# Patient Record
Sex: Male | Born: 1965 | Race: Black or African American | Hispanic: No | Marital: Single | Smoking: Former smoker
Health system: Southern US, Community
[De-identification: ages and names within clinical notes are randomized; demographics above are authoritative.]

## PROBLEM LIST (undated history)

## (undated) DIAGNOSIS — M48061 Spinal stenosis, lumbar region without neurogenic claudication: Secondary | ICD-10-CM

## (undated) DIAGNOSIS — Z8709 Personal history of other diseases of the respiratory system: Secondary | ICD-10-CM

## (undated) DIAGNOSIS — R600 Localized edema: Secondary | ICD-10-CM

## (undated) DIAGNOSIS — G4730 Sleep apnea, unspecified: Secondary | ICD-10-CM

## (undated) DIAGNOSIS — I10 Essential (primary) hypertension: Secondary | ICD-10-CM

## (undated) DIAGNOSIS — E785 Hyperlipidemia, unspecified: Secondary | ICD-10-CM

## (undated) DIAGNOSIS — H902 Conductive hearing loss, unspecified: Secondary | ICD-10-CM

## (undated) DIAGNOSIS — G629 Polyneuropathy, unspecified: Secondary | ICD-10-CM

## (undated) DIAGNOSIS — F419 Anxiety disorder, unspecified: Secondary | ICD-10-CM

## (undated) DIAGNOSIS — K30 Functional dyspepsia: Secondary | ICD-10-CM

## (undated) DIAGNOSIS — F329 Major depressive disorder, single episode, unspecified: Secondary | ICD-10-CM

## (undated) DIAGNOSIS — R351 Nocturia: Secondary | ICD-10-CM

## (undated) DIAGNOSIS — I2510 Atherosclerotic heart disease of native coronary artery without angina pectoris: Secondary | ICD-10-CM

## (undated) DIAGNOSIS — M7540 Impingement syndrome of unspecified shoulder: Secondary | ICD-10-CM

## (undated) DIAGNOSIS — I219 Acute myocardial infarction, unspecified: Secondary | ICD-10-CM

## (undated) DIAGNOSIS — F32A Depression, unspecified: Secondary | ICD-10-CM

## (undated) HISTORY — PX: CORONARY ANGIOPLASTY: SHX604

## (undated) HISTORY — PX: HERNIA REPAIR: SHX51

## (undated) HISTORY — PX: CARDIAC CATHETERIZATION: SHX172

## (undated) HISTORY — PX: COLONOSCOPY: SHX174

---

## 2002-10-30 DIAGNOSIS — I219 Acute myocardial infarction, unspecified: Secondary | ICD-10-CM

## 2002-10-30 HISTORY — DX: Acute myocardial infarction, unspecified: I21.9

## 2016-01-31 ENCOUNTER — Other Ambulatory Visit: Payer: Self-pay | Admitting: Neurological Surgery

## 2016-02-14 ENCOUNTER — Encounter (HOSPITAL_COMMUNITY): Payer: Self-pay

## 2016-02-14 ENCOUNTER — Encounter (HOSPITAL_COMMUNITY)
Admission: RE | Admit: 2016-02-14 | Discharge: 2016-02-14 | Disposition: A | Discharge status: Home / Self Care | Payer: Medicaid Other | Source: Ambulatory Visit | Attending: Neurological Surgery | Admitting: Neurological Surgery

## 2016-02-14 DIAGNOSIS — I252 Old myocardial infarction: Secondary | ICD-10-CM | POA: Insufficient documentation

## 2016-02-14 DIAGNOSIS — F419 Anxiety disorder, unspecified: Secondary | ICD-10-CM | POA: Insufficient documentation

## 2016-02-14 DIAGNOSIS — Z87891 Personal history of nicotine dependence: Secondary | ICD-10-CM | POA: Diagnosis not present

## 2016-02-14 DIAGNOSIS — Z7902 Long term (current) use of antithrombotics/antiplatelets: Secondary | ICD-10-CM | POA: Diagnosis not present

## 2016-02-14 DIAGNOSIS — F329 Major depressive disorder, single episode, unspecified: Secondary | ICD-10-CM | POA: Diagnosis not present

## 2016-02-14 DIAGNOSIS — I2510 Atherosclerotic heart disease of native coronary artery without angina pectoris: Secondary | ICD-10-CM | POA: Insufficient documentation

## 2016-02-14 DIAGNOSIS — Z01818 Encounter for other preprocedural examination: Secondary | ICD-10-CM | POA: Insufficient documentation

## 2016-02-14 DIAGNOSIS — Z955 Presence of coronary angioplasty implant and graft: Secondary | ICD-10-CM | POA: Diagnosis not present

## 2016-02-14 DIAGNOSIS — Z01812 Encounter for preprocedural laboratory examination: Secondary | ICD-10-CM | POA: Insufficient documentation

## 2016-02-14 DIAGNOSIS — G629 Polyneuropathy, unspecified: Secondary | ICD-10-CM | POA: Insufficient documentation

## 2016-02-14 DIAGNOSIS — M4806 Spinal stenosis, lumbar region: Secondary | ICD-10-CM | POA: Diagnosis not present

## 2016-02-14 DIAGNOSIS — E785 Hyperlipidemia, unspecified: Secondary | ICD-10-CM | POA: Insufficient documentation

## 2016-02-14 DIAGNOSIS — G4733 Obstructive sleep apnea (adult) (pediatric): Secondary | ICD-10-CM | POA: Insufficient documentation

## 2016-02-14 DIAGNOSIS — I10 Essential (primary) hypertension: Secondary | ICD-10-CM | POA: Diagnosis not present

## 2016-02-14 DIAGNOSIS — Z79899 Other long term (current) drug therapy: Secondary | ICD-10-CM | POA: Diagnosis not present

## 2016-02-14 DIAGNOSIS — M48061 Spinal stenosis, lumbar region without neurogenic claudication: Secondary | ICD-10-CM

## 2016-02-14 HISTORY — DX: Atherosclerotic heart disease of native coronary artery without angina pectoris: I25.10

## 2016-02-14 HISTORY — DX: Functional dyspepsia: K30

## 2016-02-14 HISTORY — DX: Hyperlipidemia, unspecified: E78.5

## 2016-02-14 HISTORY — DX: Conductive hearing loss, unspecified: H90.2

## 2016-02-14 HISTORY — DX: Impingement syndrome of unspecified shoulder: M75.40

## 2016-02-14 HISTORY — DX: Polyneuropathy, unspecified: G62.9

## 2016-02-14 HISTORY — DX: Major depressive disorder, single episode, unspecified: F32.9

## 2016-02-14 HISTORY — DX: Nocturia: R35.1

## 2016-02-14 HISTORY — DX: Acute myocardial infarction, unspecified: I21.9

## 2016-02-14 HISTORY — DX: Essential (primary) hypertension: I10

## 2016-02-14 HISTORY — DX: Anxiety disorder, unspecified: F41.9

## 2016-02-14 HISTORY — DX: Spinal stenosis, lumbar region without neurogenic claudication: M48.061

## 2016-02-14 HISTORY — DX: Personal history of other diseases of the respiratory system: Z87.09

## 2016-02-14 HISTORY — DX: Sleep apnea, unspecified: G47.30

## 2016-02-14 HISTORY — DX: Depression, unspecified: F32.A

## 2016-02-14 LAB — CBC WITH DIFFERENTIAL/PLATELET
Basophils Absolute: 0 K/uL (ref 0.0–0.1)
Basophils Relative: 1 %
Eosinophils Absolute: 0.2 K/uL (ref 0.0–0.7)
Eosinophils Relative: 4 %
HCT: 45.7 % (ref 39.0–52.0)
Hemoglobin: 14.5 g/dL (ref 13.0–17.0)
Lymphocytes Relative: 38 %
Lymphs Abs: 2 K/uL (ref 0.7–4.0)
MCH: 26.7 pg (ref 26.0–34.0)
MCHC: 31.7 g/dL (ref 30.0–36.0)
MCV: 84 fL (ref 78.0–100.0)
Monocytes Absolute: 0.5 K/uL (ref 0.1–1.0)
Monocytes Relative: 10 %
Neutro Abs: 2.6 K/uL (ref 1.7–7.7)
Neutrophils Relative %: 47 %
Platelets: 212 K/uL (ref 150–400)
RBC: 5.44 MIL/uL (ref 4.22–5.81)
RDW: 14.5 % (ref 11.5–15.5)
WBC: 5.3 K/uL (ref 4.0–10.5)

## 2016-02-14 LAB — BASIC METABOLIC PANEL
ANION GAP: 8 (ref 5–15)
BUN: 11 mg/dL (ref 6–20)
CALCIUM: 9.4 mg/dL (ref 8.9–10.3)
CO2: 24 mmol/L (ref 22–32)
CREATININE: 1.06 mg/dL (ref 0.61–1.24)
Chloride: 110 mmol/L (ref 101–111)
GFR calc Af Amer: >60 mL/min (ref >60)
GLUCOSE: 90 mg/dL (ref 65–99)
Potassium: 4.8 mmol/L (ref 3.5–5.1)
Sodium: 142 mmol/L (ref 135–145)

## 2016-02-14 LAB — PROTIME-INR
INR: 1 (ref 0.00–1.49)
PROTHROMBIN TIME: 13.4 s (ref 11.6–15.2)

## 2016-02-14 LAB — SURGICAL PCR SCREEN
MRSA, PCR: NEGATIVE
Staphylococcus aureus: NEGATIVE

## 2016-02-14 NOTE — Pre-Procedure Instructions (Signed)
    Lytle ButteGregory D Choung  02/14/2016      [facility] [place], [place] - [address] [name] [id number] Phone: [phone] Fax: [phone]    Your procedure is scheduled on Thursday, April 20th, 2017.  [facility] Ad[facility] at 10:15 A.M.   Call this number if you have problems the morning of surgery:  [phone]   Remember:  Do not eat food or drink liquids after midnight.   Take these medicines the morning of surgery with A SIP OF WATER: Baclofen (Lioresal), Fluticasone (Flonase), Gabapentin (Neurontin), Ipratropium (Atrovent), Labetalol (Normodyne), Oxycodone-acetaminophen (Percocet) if needed.  Stop taking: Clopidogrel (Plavix), Loratadine-pseudoephedrine (Allery Relief/Nasal Decongest), Aspirin, NSAIDS, Aleve, Naproxen, Ibuprofen, Advil, Motrin, BC's, Goody's, Fish oil, all herbal medications, and all vitamins.    Do not wear jewelry.  Do not wear lotions, powders, or colognes.  You may NOT wear deodorant.  Men may shave face and neck.  Do not bring valuables to the hospital.   [facility] is not responsible for any belongings or valuables.  Contacts, dentures or bridgework may not be worn into surgery.  Leave your suitcase in the car.  After surgery it may be brought to your room.  For patients admitted to the hospital, discharge time will be determined by your treatment team.  Patients discharged the day of surgery will not be allowed to drive home.   Special instructions:  See attached.   Please read over the following fact sheets that you were given. Pain Booklet, Coughing and Deep Breathing, MRSA Information and Surgical Site Infection Prevention
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
## 2016-02-15 NOTE — Progress Notes (Signed)
Anesthesia Chart Review: Patient is a 50 year old male scheduled for L3-4, L4-5, L5-S1 laminectomies and foraminotomies on 02/17/16 by Dr. Yetta BarreJones.  History includes former smoker, HTN, spinal stenosis, neuropathy, CAD/STEMI X 2 s/p DES to pLAD '04 with subsequent in-stent thrombosis '10 s/p thrombectomy and DES to mLAD, dyslipidemia, OSA (no CPAP currently), conductive hearing loss, depression, anxiety. BMI is consistent with obesity. PCP is Dr. Rae MarBrent Frederick.   Cardiologist is Dr. Tawana ScaleMaria Rangel (resident)/Dr. Therisa DoyneFrederic Kahl (attending) [facility]. Following his recent stress test (see below) she wrote,  "I had an extensive discussion with patient. His stress test shows an anterior infarct with mild peri-infarct ischemia and decreased LV function. He has no symptoms of angina or heart failure at present. He is at higher risk for peri-operative adverse cardiovascular events for his lumbar laminectomy, which is an intermediate risk surgery. At this point do not recommend invasive cardiovascular evaluation and favor medical treatment/optimization. He is already on optimal medical therapy with betablocker, ACEi, ASA, plavix and statin. The patient is very eager to have his laminectomy done and has been told he has a good chance for improvement, he has had worsening of his back pain on the past few months that has limited his functional capacity significantly.   I have instructed patient to complete an echocardiogram prior to his surgery for better structural/functional evaluation of the heart. He should also have the surgery performed at a facility where he can have cardiology consultation perioperatively. Plavix can be discontinued one week prior to surgery and restarted as soon as safe from a surgical standpoint in the post-op period. Would recommend continuing ASA perioperatively unless the risk of bleeding is prohibitively, in which case it can be stopped 7 days prior to surgery and resumed once safe in the  post-op period as well. The patient should return for outpatient follow up ~681moth after his laminectomy. These are the recommendations that will be given to his surgeon. Will send results from stress test via letter to patient.  Darrick GrinderMaria Octavia Rangel, MD Cardiology Fellow PGY-5 Pager [phone]  Meds include Lipitor, baclofen, Plavix (on hold starting 02/10/16), Flonase, Neurontin, Atrovent, lisinopril, labetalol, loratadine/pseudoephedrine, Percocet, Vitamin C.  12/14/15 EKG [facility]): SR, moderate voltage criteria for LVH, cannot rule out septal infarct (cited on on before 10/13/14). No significant change when compared to 10/13/14 tracing.  01/14/16 Echo [facility]; Care Everywhere): SUMMARY There is moderate concentric left ventricular hypertrophy. The left ventricular size is normal. Left ventricular systolic function is normal. LV ejection fraction = 55-60%. There are regional wall motion abnormalities as specified below. Mild mid to distal anterior and anteroseptal hypokinesis IVC size was normal. The right ventricle is normal in size and function. The left atrium is mildly dilated. No significant stenosis or regurgitation seen (trace MR/TR/PR) There is no pericardial effusion.  01/05/16 Nuclear stress test [facility]; Care Everywhere): 1. Myocardial infarction with peri-infarct ischemia in the apical anteroseptal wall. Myocardial infarction in the inferior apical segment. 2. Normal left ventricular function.  11/12/09 24 hr Holter monitor [facility]; Care Everywhere): SR with rare PACS and PVCs. No diary.  09/07/09 Cardiac Cath [facility]; Care Everywhere): Right dominance. 100% proximal LAD, in stent, 40% ostial. 90% mid LAD. 60% ostial CX. LM and RCA 0%. Conclusion: Obstructive 1V CAD. LVF 45% with abnormal wall motion (anterolateral and apical akinesis). S/P balloon/thrombectomy/ballon to proximal LAD and thrombectomy/balloon/Everolimus DES to mid LAD.  02/14/16 CXR: IMPRESSION: No active cardiopulmonary  disease.  Preoperative labs noted.   Patient has cardiac clearance with  no additional testing recommended prior to planned procedure. If no acute changes then I would anticipate that he could proceed as planned.  Velna Ochs [facility] Short Stay Center/Anesthesiology Phone [phone] 02/15/2016 10:28 AM

## 2016-02-16 NOTE — Anesthesia Preprocedure Evaluation (Addendum)
Anesthesia Evaluation  Patient identified by MRN, date of birth, ID band Patient awake    Reviewed: Allergy & Precautions, NPO status , Patient's Chart, lab work & pertinent test results, reviewed documented beta blocker date and time   Airway Mallampati: II   Neck ROM: Full    Dental  (+) Teeth Intact   Pulmonary sleep apnea (no CPAP) , former smoker,    breath sounds clear to auscultation       Cardiovascular hypertension, Pt. on medications + CAD, + Past MI (STEMI x 2) and + Cardiac Stents (mid LAD 2010)   Rhythm:Regular  Cleared by cardiology with no additional testing.  At increased risk, to stop plavix for 7 days prior, ECHO 12/2015 EF 60%, hypokinesis ant.septal area, Stress also with Ant. Septal ischemic area.     Neuro/Psych Anxiety Depression    GI/Hepatic negative GI ROS, Neg liver ROS,   Endo/Other  negative endocrine ROSOBESITY BMI 35  Renal/GU negative Renal ROS  negative genitourinary   Musculoskeletal Back pain   Abdominal   Peds  Hematology 14/46   Anesthesia Other Findings   Reproductive/Obstetrics                          Anesthesia Physical Anesthesia Plan  ASA: III  Anesthesia Plan: General   Post-op Pain Management:    Induction: Intravenous  Airway Management Planned: Oral ETT  Additional Equipment:   Intra-op Plan:   Post-operative Plan:   Informed Consent: I have reviewed the patients History and Physical, chart, labs and discussed the procedure including the risks, benefits and alternatives for the proposed anesthesia with the patient or authorized representative who has indicated his/her understanding and acceptance.     Plan Discussed with:   Anesthesia Plan Comments: ( will need to maintain perfusion pressure, will be off Plavix, at risk for Acute coronary syndrome and STENT thrombosis, Be nice if ASA could be continued, his last ASA was yesterday)        Anesthesia Quick Evaluation

## 2016-02-17 ENCOUNTER — Ambulatory Visit (HOSPITAL_COMMUNITY): Payer: Worker's Compensation | Admitting: Vascular Surgery

## 2016-02-17 ENCOUNTER — Ambulatory Visit (HOSPITAL_COMMUNITY)
Admission: RE | Admit: 2016-02-17 | Discharge: 2016-02-18 | Disposition: A | Discharge status: Home / Self Care | Payer: Worker's Compensation | Source: Ambulatory Visit | Attending: Neurological Surgery | Admitting: Neurological Surgery

## 2016-02-17 ENCOUNTER — Ambulatory Visit (HOSPITAL_COMMUNITY): Payer: Worker's Compensation | Admitting: Anesthesiology

## 2016-02-17 ENCOUNTER — Encounter (HOSPITAL_COMMUNITY)
Admission: RE | Disposition: A | Discharge status: Home / Self Care | Payer: Self-pay | Source: Ambulatory Visit | Attending: Neurological Surgery

## 2016-02-17 ENCOUNTER — Ambulatory Visit (HOSPITAL_COMMUNITY): Payer: Worker's Compensation

## 2016-02-17 ENCOUNTER — Encounter (HOSPITAL_COMMUNITY): Payer: Self-pay | Admitting: *Deleted

## 2016-02-17 DIAGNOSIS — E785 Hyperlipidemia, unspecified: Secondary | ICD-10-CM | POA: Diagnosis not present

## 2016-02-17 DIAGNOSIS — G629 Polyneuropathy, unspecified: Secondary | ICD-10-CM | POA: Diagnosis not present

## 2016-02-17 DIAGNOSIS — Z9889 Other specified postprocedural states: Secondary | ICD-10-CM

## 2016-02-17 DIAGNOSIS — Z955 Presence of coronary angioplasty implant and graft: Secondary | ICD-10-CM | POA: Insufficient documentation

## 2016-02-17 DIAGNOSIS — I10 Essential (primary) hypertension: Secondary | ICD-10-CM | POA: Insufficient documentation

## 2016-02-17 DIAGNOSIS — Z6835 Body mass index (BMI) 35.0-35.9, adult: Secondary | ICD-10-CM | POA: Diagnosis not present

## 2016-02-17 DIAGNOSIS — M4806 Spinal stenosis, lumbar region: Secondary | ICD-10-CM | POA: Diagnosis not present

## 2016-02-17 DIAGNOSIS — I252 Old myocardial infarction: Secondary | ICD-10-CM | POA: Diagnosis not present

## 2016-02-17 DIAGNOSIS — I2510 Atherosclerotic heart disease of native coronary artery without angina pectoris: Secondary | ICD-10-CM | POA: Diagnosis not present

## 2016-02-17 DIAGNOSIS — Z87891 Personal history of nicotine dependence: Secondary | ICD-10-CM | POA: Insufficient documentation

## 2016-02-17 DIAGNOSIS — Z7902 Long term (current) use of antithrombotics/antiplatelets: Secondary | ICD-10-CM | POA: Insufficient documentation

## 2016-02-17 DIAGNOSIS — M549 Dorsalgia, unspecified: Secondary | ICD-10-CM

## 2016-02-17 DIAGNOSIS — G4730 Sleep apnea, unspecified: Secondary | ICD-10-CM | POA: Diagnosis not present

## 2016-02-17 DIAGNOSIS — Z79899 Other long term (current) drug therapy: Secondary | ICD-10-CM | POA: Insufficient documentation

## 2016-02-17 DIAGNOSIS — E669 Obesity, unspecified: Secondary | ICD-10-CM | POA: Diagnosis not present

## 2016-02-17 DIAGNOSIS — Z7951 Long term (current) use of inhaled steroids: Secondary | ICD-10-CM | POA: Insufficient documentation

## 2016-02-17 HISTORY — PX: LUMBAR LAMINECTOMY/DECOMPRESSION MICRODISCECTOMY: SHX5026

## 2016-02-17 SURGERY — LUMBAR LAMINECTOMY/DECOMPRESSION MICRODISCECTOMY 3 LEVELS
Anesthesia: General | Site: Back | Laterality: Bilateral

## 2016-02-17 MED ORDER — ACETAMINOPHEN 650 MG RE SUPP: 650.0000 mg | RECTAL | Status: DC | PRN

## 2016-02-17 MED ORDER — PROPOFOL 10 MG/ML IV BOLUS
INTRAVENOUS | Status: AC
  Filled 2016-02-17: qty 20

## 2016-02-17 MED ORDER — FENTANYL CITRATE (PF) 250 MCG/5ML IJ SOLN
INTRAMUSCULAR | Status: AC
  Filled 2016-02-17: qty 5

## 2016-02-17 MED ORDER — POTASSIUM CHLORIDE IN NACL 20-0.9 MEQ/L-% IV SOLN
INTRAVENOUS | Status: DC
  Filled 2016-02-17 (×3): qty 1000

## 2016-02-17 MED ORDER — FLUTICASONE PROPIONATE 50 MCG/ACT NA SUSP
1.0000 | Freq: Two times a day (BID) | NASAL | Status: DC
Start: 2016-02-17 — End: 2016-02-18
  Administered 2016-02-17 – 2016-02-18 (×2): 1 via NASAL
  Filled 2016-02-17: qty 16

## 2016-02-17 MED ORDER — BACLOFEN 10 MG PO TABS
10.0000 mg | ORAL_TABLET | Freq: Three times a day (TID) | ORAL | Status: DC
  Administered 2016-02-17 – 2016-02-18 (×3): 10 mg via ORAL
  Filled 2016-02-17 (×4): qty 1

## 2016-02-17 MED ORDER — ONDANSETRON HCL 4 MG/2ML IJ SOLN
INTRAMUSCULAR | Status: AC
  Filled 2016-02-17: qty 2

## 2016-02-17 MED ORDER — GABAPENTIN 300 MG PO CAPS
300.0000 mg | ORAL_CAPSULE | Freq: Three times a day (TID) | ORAL | Status: DC
  Administered 2016-02-17 – 2016-02-18 (×3): 300 mg via ORAL
  Filled 2016-02-17 (×3): qty 1

## 2016-02-17 MED ORDER — THROMBIN 5000 UNITS EX SOLR
OROMUCOSAL | Status: DC | PRN
  Administered 2016-02-17: 10 mL via TOPICAL

## 2016-02-17 MED ORDER — GLYCOPYRROLATE 0.2 MG/ML IJ SOLN
INTRAMUSCULAR | Status: DC | PRN
  Administered 2016-02-17: 0.6 mg via INTRAVENOUS

## 2016-02-17 MED ORDER — CEFAZOLIN SODIUM-DEXTROSE 2-4 GM/100ML-% IV SOLN
2.0000 g | INTRAVENOUS | Status: AC
  Administered 2016-02-17: 2 g via INTRAVENOUS
  Filled 2016-02-17: qty 100

## 2016-02-17 MED ORDER — ACETAMINOPHEN 325 MG PO TABS: 650.0000 mg | ORAL_TABLET | ORAL | Status: DC | PRN

## 2016-02-17 MED ORDER — FENTANYL CITRATE (PF) 100 MCG/2ML IJ SOLN
INTRAMUSCULAR | Status: DC | PRN
  Administered 2016-02-17 (×5): 50 ug via INTRAVENOUS

## 2016-02-17 MED ORDER — PROPOFOL 10 MG/ML IV BOLUS
INTRAVENOUS | Status: DC | PRN
  Administered 2016-02-17: 200 mg via INTRAVENOUS

## 2016-02-17 MED ORDER — OXYCODONE-ACETAMINOPHEN 10-325 MG PO TABS: 1.0000 | ORAL_TABLET | ORAL | Status: DC | PRN

## 2016-02-17 MED ORDER — SODIUM CHLORIDE 0.9 % IV SOLN: 250.0000 mL | INTRAVENOUS | Status: DC

## 2016-02-17 MED ORDER — MIDAZOLAM HCL 2 MG/2ML IJ SOLN
INTRAMUSCULAR | Status: AC
  Filled 2016-02-17: qty 2

## 2016-02-17 MED ORDER — LIDOCAINE HCL (CARDIAC) 20 MG/ML IV SOLN
INTRAVENOUS | Status: DC | PRN
  Administered 2016-02-17: 100 mg via INTRAVENOUS

## 2016-02-17 MED ORDER — DEXAMETHASONE SODIUM PHOSPHATE 10 MG/ML IJ SOLN
10.0000 mg | INTRAMUSCULAR | Status: AC
  Administered 2016-02-17: 10 mg via INTRAVENOUS

## 2016-02-17 MED ORDER — ONDANSETRON HCL 4 MG/2ML IJ SOLN
INTRAMUSCULAR | Status: DC | PRN
  Administered 2016-02-17: 4 mg via INTRAVENOUS

## 2016-02-17 MED ORDER — MENTHOL 3 MG MT LOZG
1.0000 | LOZENGE | OROMUCOSAL | Status: DC | PRN
  Filled 2016-02-17: qty 9

## 2016-02-17 MED ORDER — VANCOMYCIN HCL 1000 MG IV SOLR
INTRAVENOUS | Status: DC | PRN
  Administered 2016-02-17: 1000 mg

## 2016-02-17 MED ORDER — LISINOPRIL 20 MG PO TABS
20.0000 mg | ORAL_TABLET | Freq: Every day | ORAL | Status: DC
  Administered 2016-02-17 – 2016-02-18 (×2): 20 mg via ORAL
  Filled 2016-02-17 (×2): qty 1

## 2016-02-17 MED ORDER — NEOSTIGMINE METHYLSULFATE 10 MG/10ML IV SOLN
INTRAVENOUS | Status: DC | PRN
  Administered 2016-02-17: 4 mg via INTRAVENOUS

## 2016-02-17 MED ORDER — MEPERIDINE HCL 25 MG/ML IJ SOLN: 6.2500 mg | INTRAMUSCULAR | Status: DC | PRN

## 2016-02-17 MED ORDER — GLYCOPYRROLATE 0.2 MG/ML IJ SOLN
INTRAMUSCULAR | Status: AC
  Filled 2016-02-17: qty 3

## 2016-02-17 MED ORDER — ARTIFICIAL TEARS OP OINT
TOPICAL_OINTMENT | OPHTHALMIC | Status: AC
  Filled 2016-02-17: qty 3.5

## 2016-02-17 MED ORDER — MORPHINE SULFATE (PF) 2 MG/ML IV SOLN: 1.0000 mg | INTRAVENOUS | Status: DC | PRN

## 2016-02-17 MED ORDER — FENTANYL CITRATE (PF) 100 MCG/2ML IJ SOLN: 25.0000 ug | INTRAMUSCULAR | Status: DC | PRN

## 2016-02-17 MED ORDER — SODIUM CHLORIDE 0.9 % IR SOLN
Status: DC | PRN
  Administered 2016-02-17: 500 mL

## 2016-02-17 MED ORDER — PHENOL 1.4 % MT LIQD
1.0000 | OROMUCOSAL | Status: DC | PRN
  Administered 2016-02-17: 1 via OROMUCOSAL
  Filled 2016-02-17: qty 177

## 2016-02-17 MED ORDER — IPRATROPIUM BROMIDE 0.06 % NA SOLN
1.0000 | Freq: Two times a day (BID) | NASAL | Status: DC
  Administered 2016-02-17 – 2016-02-18 (×2): 1 via NASAL
  Filled 2016-02-17: qty 15

## 2016-02-17 MED ORDER — CEFAZOLIN SODIUM 1-5 GM-% IV SOLN
1.0000 g | Freq: Three times a day (TID) | INTRAVENOUS | Status: AC
  Administered 2016-02-17 – 2016-02-18 (×2): 1 g via INTRAVENOUS
  Filled 2016-02-17 (×2): qty 50

## 2016-02-17 MED ORDER — THROMBIN 5000 UNITS EX SOLR
CUTANEOUS | Status: DC | PRN
  Administered 2016-02-17 (×2): 5000 [IU] via TOPICAL

## 2016-02-17 MED ORDER — HEMOSTATIC AGENTS (NO CHARGE) OPTIME
TOPICAL | Status: DC | PRN
  Administered 2016-02-17: 1 via TOPICAL

## 2016-02-17 MED ORDER — FENTANYL CITRATE (PF) 100 MCG/2ML IJ SOLN
INTRAMUSCULAR | Status: AC
  Filled 2016-02-17: qty 4

## 2016-02-17 MED ORDER — OXYCODONE HCL 5 MG PO TABS
5.0000 mg | ORAL_TABLET | ORAL | Status: DC | PRN
  Administered 2016-02-17 – 2016-02-18 (×4): 5 mg via ORAL
  Filled 2016-02-17 (×4): qty 1

## 2016-02-17 MED ORDER — 0.9 % SODIUM CHLORIDE (POUR BTL) OPTIME
TOPICAL | Status: DC | PRN
  Administered 2016-02-17: 1000 mL

## 2016-02-17 MED ORDER — ONDANSETRON HCL 4 MG/2ML IJ SOLN: 4.0000 mg | INTRAMUSCULAR | Status: DC | PRN

## 2016-02-17 MED ORDER — DEXAMETHASONE SODIUM PHOSPHATE 10 MG/ML IJ SOLN
INTRAMUSCULAR | Status: AC
  Filled 2016-02-17: qty 1

## 2016-02-17 MED ORDER — OXYCODONE-ACETAMINOPHEN 5-325 MG PO TABS
1.0000 | ORAL_TABLET | ORAL | Status: DC | PRN
Start: 2016-02-17 — End: 2016-02-18
  Administered 2016-02-17 – 2016-02-18 (×4): 1 via ORAL
  Filled 2016-02-17 (×4): qty 1

## 2016-02-17 MED ORDER — LABETALOL HCL 200 MG PO TABS
200.0000 mg | ORAL_TABLET | Freq: Two times a day (BID) | ORAL | Status: DC
  Administered 2016-02-17 – 2016-02-18 (×2): 200 mg via ORAL
  Filled 2016-02-17 (×2): qty 1

## 2016-02-17 MED ORDER — MIDAZOLAM HCL 5 MG/5ML IJ SOLN
INTRAMUSCULAR | Status: DC | PRN
  Administered 2016-02-17: 2 mg via INTRAVENOUS

## 2016-02-17 MED ORDER — VITAMIN C 500 MG PO TABS
500.0000 mg | ORAL_TABLET | Freq: Every day | ORAL | Status: DC
  Administered 2016-02-17 – 2016-02-18 (×2): 500 mg via ORAL
  Filled 2016-02-17 (×2): qty 1

## 2016-02-17 MED ORDER — LACTATED RINGERS IV SOLN
INTRAVENOUS | Status: DC
  Administered 2016-02-17 (×2): via INTRAVENOUS

## 2016-02-17 MED ORDER — ACETAMINOPHEN 10 MG/ML IV SOLN
INTRAVENOUS | Status: DC | PRN
  Administered 2016-02-17: 1000 mg via INTRAVENOUS

## 2016-02-17 MED ORDER — SODIUM CHLORIDE 0.9% FLUSH: 3.0000 mL | INTRAVENOUS | Status: DC | PRN

## 2016-02-17 MED ORDER — VANCOMYCIN HCL 1000 MG IV SOLR
INTRAVENOUS | Status: AC
  Filled 2016-02-17: qty 1000

## 2016-02-17 MED ORDER — ONDANSETRON HCL 4 MG/2ML IJ SOLN: 4.0000 mg | Freq: Once | INTRAMUSCULAR | Status: DC

## 2016-02-17 MED ORDER — LIDOCAINE HCL (CARDIAC) 20 MG/ML IV SOLN
INTRAVENOUS | Status: AC
  Filled 2016-02-17: qty 5

## 2016-02-17 MED ORDER — SODIUM CHLORIDE 0.9% FLUSH
3.0000 mL | Freq: Two times a day (BID) | INTRAVENOUS | Status: DC
  Administered 2016-02-17: 3 mL via INTRAVENOUS

## 2016-02-17 MED ORDER — ROCURONIUM BROMIDE 100 MG/10ML IV SOLN
INTRAVENOUS | Status: DC | PRN
  Administered 2016-02-17: 10 mg via INTRAVENOUS
  Administered 2016-02-17 (×2): 20 mg via INTRAVENOUS
  Administered 2016-02-17: 10 mg via INTRAVENOUS
  Administered 2016-02-17: 40 mg via INTRAVENOUS

## 2016-02-17 SURGICAL SUPPLY — 45 items
ADH SKN CLS APL DERMABOND .7 (GAUZE/BANDAGES/DRESSINGS) ×1
APL SKNCLS STERI-STRIP NONHPOA (GAUZE/BANDAGES/DRESSINGS) ×1
BAG DECANTER FOR FLEXI CONT (MISCELLANEOUS) ×3 IMPLANT
BENZOIN TINCTURE PRP APPL 2/3 (GAUZE/BANDAGES/DRESSINGS) ×3 IMPLANT
BUR MATCHSTICK NEURO 3.0 LAGG (BURR) ×3 IMPLANT
CANISTER SUCT 3000ML PPV (MISCELLANEOUS) ×3 IMPLANT
CLOSURE WOUND 1/2 X4 (GAUZE/BANDAGES/DRESSINGS) ×1
DERMABOND ADVANCED (GAUZE/BANDAGES/DRESSINGS) ×2
DERMABOND ADVANCED .7 DNX12 (GAUZE/BANDAGES/DRESSINGS) IMPLANT
DRAPE LAPAROTOMY 100X72X124 (DRAPES) ×3 IMPLANT
DRAPE MICROSCOPE LEICA (MISCELLANEOUS) ×3 IMPLANT
DRAPE POUCH INSTRU U-SHP 10X18 (DRAPES) ×3 IMPLANT
DRAPE SURG 17X23 STRL (DRAPES) ×3 IMPLANT
DRSG OPSITE 4X5.5 SM (GAUZE/BANDAGES/DRESSINGS) ×2 IMPLANT
DRSG OPSITE POSTOP 4X6 (GAUZE/BANDAGES/DRESSINGS) ×3 IMPLANT
DURAPREP 26ML APPLICATOR (WOUND CARE) ×3 IMPLANT
ELECT REM PT RETURN 9FT ADLT (ELECTROSURGICAL) ×3
ELECTRODE REM PT RTRN 9FT ADLT (ELECTROSURGICAL) ×1 IMPLANT
GAUZE SPONGE 4X4 16PLY XRAY LF (GAUZE/BANDAGES/DRESSINGS) IMPLANT
GLOVE BIO SURGEON STRL SZ8 (GLOVE) ×3 IMPLANT
GOWN STRL REUS W/ TWL LRG LVL3 (GOWN DISPOSABLE) IMPLANT
GOWN STRL REUS W/ TWL XL LVL3 (GOWN DISPOSABLE) ×1 IMPLANT
GOWN STRL REUS W/TWL 2XL LVL3 (GOWN DISPOSABLE) IMPLANT
GOWN STRL REUS W/TWL LRG LVL3 (GOWN DISPOSABLE)
GOWN STRL REUS W/TWL XL LVL3 (GOWN DISPOSABLE) ×3
HEMOSTAT POWDER KIT SURGIFOAM (HEMOSTASIS) IMPLANT
KIT BASIN OR (CUSTOM PROCEDURE TRAY) ×3 IMPLANT
KIT ROOM TURNOVER OR (KITS) ×3 IMPLANT
NDL HYPO 25X1 1.5 SAFETY (NEEDLE) ×1 IMPLANT
NEEDLE HYPO 25X1 1.5 SAFETY (NEEDLE) ×3 IMPLANT
NEEDLE SPNL 20GX3.5 QUINCKE YW (NEEDLE) IMPLANT
NS IRRIG 1000ML POUR BTL (IV SOLUTION) ×3 IMPLANT
PACK LAMINECTOMY NEURO (CUSTOM PROCEDURE TRAY) ×3 IMPLANT
PAD ARMBOARD 7.5X6 YLW CONV (MISCELLANEOUS) ×9 IMPLANT
RUBBERBAND STERILE (MISCELLANEOUS) ×6 IMPLANT
SPONGE SURGIFOAM ABS GEL SZ50 (HEMOSTASIS) ×3 IMPLANT
STRIP CLOSURE SKIN 1/2X4 (GAUZE/BANDAGES/DRESSINGS) ×2 IMPLANT
SUT VIC AB 0 CT1 18XCR BRD8 (SUTURE) ×1 IMPLANT
SUT VIC AB 0 CT1 8-18 (SUTURE) ×3
SUT VIC AB 2-0 CP2 18 (SUTURE) ×3 IMPLANT
SUT VIC AB 3-0 SH 8-18 (SUTURE) ×3 IMPLANT
TAPE STRIPS DRAPE STRL (GAUZE/BANDAGES/DRESSINGS) ×2 IMPLANT
TOWEL OR 17X24 6PK STRL BLUE (TOWEL DISPOSABLE) ×3 IMPLANT
TOWEL OR 17X26 10 PK STRL BLUE (TOWEL DISPOSABLE) ×3 IMPLANT
WATER STERILE IRR 1000ML POUR (IV SOLUTION) ×3 IMPLANT

## 2016-02-17 NOTE — Anesthesia Procedure Notes (Signed)
Procedure Name: Intubation Date/Time: 02/17/2016 12:07 PM Performed by: Fransisca KaufmannMEYER, Karrah Mangini E Pre-anesthesia Checklist: Patient identified, Emergency Drugs available, Suction available and Patient being monitored Patient Re-evaluated:Patient Re-evaluated prior to inductionOxygen Delivery Method: Circle System Utilized Preoxygenation: Pre-oxygenation with 100% oxygen Intubation Type: IV induction Ventilation: Mask ventilation without difficulty and Two handed mask ventilation required Grade View: Grade II Tube type: Oral Tube size: 7.5 mm Number of attempts: 2 Airway Equipment and Method: Stylet and Oral airway Placement Confirmation: ETT inserted through vocal cords under direct vision,  positive ETCO2 and breath sounds checked- equal and bilateral Secured at: 22 cm Tube secured with: Tape Dental Injury: Teeth and Oropharynx as per pre-operative assessment  Difficulty Due To: Difficulty was anticipated and Difficult Airway- due to anterior larynx

## 2016-02-17 NOTE — H&P (Signed)
Subjective: Patient is a 50 y.o. male admitted for lumbar stenosis. Onset of symptoms was several months ago, gradually worsening since that time.  The pain is rated severe, and is located at the across the lower back and radiates to legs. The pain is described as aching and occurs all day. The symptoms have been progressive. Symptoms are exacerbated by nothing in particular. MRI or CT showed stenosis   Past Medical History  Diagnosis Date  . Hypertension   . Neuropathy (HCC)   . Lumbar spinal stenosis   . Conductive hearing loss   . Coronary artery disease   . Dyslipidemia   . Impingement syndrome of shoulder     right shoulder  . Myocardial infarction [facility](HCC) 2004    x2  . Sleep apnea     diagnosed 02/10/2016 - does not have CPAP at this time  . History of bronchitis   . Anxiety   . Depression   . Nocturia   . Acid indigestion     Past Surgical History  Procedure Laterality Date  . Cardiac catheterization      with stent placement  . Coronary angioplasty    . Hernia repair      "as an infant"  . Colonoscopy      Prior to Admission medications   Medication Sig Start Date End Date Taking? Authorizing Provider  atorvastatin (LIPITOR) 20 MG tablet Take 20 mg by mouth daily.   Yes Historical Provider, MD  baclofen (LIORESAL) 10 MG tablet Take 10 mg by mouth 3 (three) times daily.   Yes Historical Provider, MD  clopidogrel (PLAVIX) 75 MG tablet Take 75 mg by mouth daily.   Yes Historical Provider, MD  fluticasone (FLONASE) 50 MCG/ACT nasal spray Place 1 spray into both nostrils 2 (two) times daily.    Yes Historical Provider, MD  gabapentin (NEURONTIN) 100 MG capsule Take 300 mg by mouth 3 (three) times daily.   Yes Historical Provider, MD  ipratropium (ATROVENT) 0.06 % nasal spray Place 1 spray into both nostrils 2 (two) times daily.   Yes Historical Provider, MD  labetalol (NORMODYNE) 200 MG tablet Take 200 mg by mouth 2 (two) times daily.   Yes Historical Provider, MD   lisinopril (PRINIVIL,ZESTRIL) 20 MG tablet Take 20 mg by mouth daily.   Yes Historical Provider, MD  loratadine-pseudoephedrine (ALLERGY RELIEF/NASAL DECONGEST) 10-240 MG 24 hr tablet Take 1 tablet by mouth daily.   Yes Historical Provider, MD  oxyCODONE-acetaminophen (PERCOCET) 10-325 MG tablet Take 1 tablet by mouth every 4 (four) hours as needed for pain.   Yes Historical Provider, MD  VITAMIN A PO Take 1 tablet by mouth daily.   Yes Historical Provider, MD  vitamin C (ASCORBIC ACID) 500 MG tablet Take 500 mg by mouth daily.   Yes Historical Provider, MD   No Known Allergies  Social History  Substance Use Topics  . Smoking status: Former Games developermoker  . Smokeless tobacco: Never Used     Comment: qiut 1999  . Alcohol Use: 1.2 oz/week    2 Cans of beer per week     Comment: socially    History reviewed. No pertinent family history.   Review of Systems  Positive ROS: neg  All other systems have been reviewed and were otherwise negative with the exception of those mentioned in the HPI and as above.  Objective: Vital signs in last 24 hours: Temp:  [98.5 F (36.9 C)] 98.5 F (36.9 C) (04/20 1018) Pulse Rate:  [79] 79 (  04/20 1018) Resp:  [20] 20 (04/20 1018) BP: (158-167)/(106-112) 167/106 mmHg (04/20 1033) SpO2:  [100 %] 100 % (04/20 1018) Weight:  [109.317 kg (241 lb)] 109.317 kg (241 lb) (04/20 1018)  General Appearance: Alert, cooperative, no distress, appears stated age Head: Normocephalic, without obvious abnormality, atraumatic Eyes: PERRL, conjunctiva/corneas clear, EOM's intact    Neck: Supple, symmetrical, trachea midline Back: Symmetric, no curvature, ROM normal, no CVA tenderness Lungs:  respirations unlabored Heart: Regular rate and rhythm Abdomen: Soft, non-tender Extremities: Extremities normal, atraumatic, no cyanosis or edema Pulses: 2+ and symmetric all extremities Skin: Skin color, texture, turgor normal, no rashes or lesions  NEUROLOGIC:   Mental status:  Alert and oriented x4,  no aphasia, good attention span, fund of knowledge, and memory Motor Exam - grossly normal Sensory Exam - grossly normal Reflexes: 1+ Coordination - grossly normal Gait - grossly normal Balance - grossly normal Cranial Nerves: I: smell Not tested  II: visual acuity  OS: nl    OD: nl  II: visual fields Full to confrontation  II: pupils Equal, round, reactive to light  III,VII: ptosis None  III,IV,VI: extraocular muscles  Full ROM  V: mastication Normal  V: facial light touch sensation  Normal  V,VII: corneal reflex  Present  VII: facial muscle function - upper  Normal  VII: facial muscle function - lower Normal  VIII: hearing Not tested  IX: soft palate elevation  Normal  IX,X: gag reflex Present  XI: trapezius strength  5/5  XI: sternocleidomastoid strength 5/5  XI: neck flexion strength  5/5  XII: tongue strength  Normal    Data Review Lab Results  Component Value Date   WBC 5.3 02/14/2016   HGB 14.5 02/14/2016   HCT 45.7 02/14/2016   MCV 84.0 02/14/2016   PLT 212 02/14/2016   Lab Results  Component Value Date   NA 142 02/14/2016   K 4.8 02/14/2016   CL 110 02/14/2016   CO2 24 02/14/2016   BUN 11 02/14/2016   CREATININE 1.06 02/14/2016   GLUCOSE 90 02/14/2016   Lab Results  Component Value Date   INR 1.00 02/14/2016    Assessment/Plan: Patient admitted for LL FOR STENOSIS. Patient has failed a reasonable attempt at conservative therapy.  I explained the condition and procedure to the patient and answered any questions.  Patient wishes to proceed with procedure as planned. Understands risks/ benefits and typical outcomes of procedure.   Letti Towell S 02/17/2016 11:17 AM

## 2016-02-17 NOTE — Op Note (Signed)
02/17/2016  2:22 PM  PATIENT:  Leonard Herrera  50 y.o. male  PRE-OPERATIVE DIAGNOSIS:  Lumbar spinal stenosis  POST-OPERATIVE DIAGNOSIS:  Same  PROCEDURE:  Decompressive lumbar laminectomy and medial facetectomy and foraminotomy L3-4 L4-5 and L5-S1 bilaterally  SURGEON:  Marikay Alaravid Kalynne Womac, MD  ASSISTANTS: Dr. Bevely Palmerditty  ANESTHESIA:   General  EBL: 150 ml  Total I/O In: 1700 [I.V.:1700] Out: 150 [Blood:150]  BLOOD ADMINISTERED:none  DRAINS: Medium Hemovac   SPECIMEN:  No Specimen  INDICATION FOR PROCEDURE: This patient presented with severe neurogenic claudication with back and leg pain with ambulation. He had it for many months. He tried conservative therapies without relief. I recommended decompressive laminectomy L3-L5. Patient understood the risks, benefits, and alternatives and potential outcomes and wished to proceed.  PROCEDURE DETAILS: The patient was taken to the operating room and after induction of adequate generalized endotracheal anesthesia, the patient was rolled into the prone position on the Wilson frame and all pressure points were padded. The lumbar region was cleaned and then prepped with DuraPrep and draped in the usual sterile fashion. 5 cc of local anesthesia was injected and then a dorsal midline incision was made and carried down to the lumbo sacral fascia. The fascia was opened and the paraspinous musculature was taken down in a subperiosteal fashion to expose L3-L5 bilaterally. Intraoperative x-ray confirmed my level, and then I used a combination of the high-speed drill and the Kerrison punches to perform a laminectomy, medial facetectomy, and foraminotomy at L3-4 L4-5 and L5-S1 bilaterally. The underlying yellow ligament was opened and removed in a piecemeal fashion to expose the underlying dura and exiting nerve roots. He had very severe central and lateral recess stenosis. Significant amounts of the ligament was removed. He also had bony stenosis. I undercut the  lateral recess and dissected down until I was medial to and distal to the pedicle at each level. The nerve root was well decompressed at each level.  I then palpated with a coronary dilator along the nerve roots and into the foramina to assure adequate decompression. I felt no more compression of the nerve roots and the central canal appeared to be well decompressed from L3-L5. I irrigated with saline solution containing bacitracin. Achieved hemostasis with bipolar cautery, lined the dura with Gelfoam, and then closed the fascia with 0 Vicryl. I closed the subcutaneous tissues with 2-0 Vicryl and the subcuticular tissues with 3-0 Vicryl. The skin was then closed with benzoin and Steri-Strips. The drapes were removed, a sterile dressing was applied. The patient was awakened from general anesthesia and transferred to the recovery room in stable condition. At the end of the procedure all sponge, needle and instrument counts were correct.   PLAN OF CARE: Admit for overnight observation  PATIENT DISPOSITION:  PACU - hemodynamically stable.   Delay start of Pharmacological VTE agent (>24hrs) due to surgical blood loss or risk of bleeding:  yes

## 2016-02-17 NOTE — Transfer of Care (Signed)
Immediate Anesthesia Transfer of Care Note  Patient: Leonard Herrera  Procedure(s) Performed: Procedure(s) with comments: Laminectomy and Foraminotomy - L3-L4 - L4-L5 - L5-S1 - bilateral (Bilateral) - Laminectomy and Foraminotomy - L3-L4 - L4-L5 - L5-S1 - bilateral  Patient Location: PACU  Anesthesia Type:General  Level of Consciousness: awake, alert , oriented and sedated  Airway & Oxygen Therapy: Patient Spontanous Breathing and Patient connected to nasal cannula oxygen  Post-op Assessment: Report given to RN, Post -op Vital signs reviewed and stable and Patient moving all extremities  Post vital signs: Reviewed and stable  Last Vitals:  Filed Vitals:   02/17/16 1018 02/17/16 1033  BP: 158/112 167/106  Pulse: 79   Temp: 36.9 C   Resp: 20     Complications: No apparent anesthesia complications

## 2016-02-17 NOTE — Anesthesia Postprocedure Evaluation (Signed)
Anesthesia Post Note  Patient: Leonard Herrera  Procedure(s) Performed: Procedure(s) (LRB): Laminectomy and Foraminotomy - L3-L4 - L4-L5 - L5-S1 - bilateral (Bilateral)  Patient location during evaluation: PACU Anesthesia Type: General Level of consciousness: awake and alert Pain management: pain level controlled Vital Signs Assessment: post-procedure vital signs reviewed and stable Respiratory status: spontaneous breathing, nonlabored ventilation, respiratory function stable and patient connected to nasal cannula oxygen Cardiovascular status: blood pressure returned to baseline and stable Postop Assessment: no signs of nausea or vomiting Anesthetic complications: no    Last Vitals:  Filed Vitals:   02/17/16 1500 02/17/16 1515  BP: 163/92 149/104  Pulse: 63 68  Temp:    Resp:      Last Pain:  Filed Vitals:   02/17/16 1519  PainSc: 2                  Sebastian Acheheodore Keniesha Adderly

## 2016-02-18 ENCOUNTER — Encounter (HOSPITAL_COMMUNITY): Payer: Self-pay | Admitting: Neurological Surgery

## 2016-02-18 DIAGNOSIS — M4806 Spinal stenosis, lumbar region: Secondary | ICD-10-CM | POA: Diagnosis not present

## 2016-02-18 MED ORDER — OXYCODONE-ACETAMINOPHEN 10-325 MG PO TABS: 1.0000 | ORAL_TABLET | ORAL | Status: AC | PRN

## 2016-02-18 NOTE — Discharge Summary (Signed)
Physician Discharge Summary  Patient ID: Leonard Herrera MRN: [id number] DOB/AGE: 1966/01/14 50 y.o.  Admit date: 02/17/2016 Discharge date: 02/18/2016  Admission Diagnoses: Lumbar spinal stenosis    Discharge Diagnoses: Same   Discharged Condition: good  Hospital Course: The patient was admitted on 02/17/2016 and taken to the operating room where the patient underwent decompressive laminectomy L3-L5. The patient tolerated the procedure well and was taken to the recovery room and then to the floor in stable condition. The hospital course was routine. There were no complications. The wound remained clean dry and intact. Pt had appropriate back soreness. No complaints of leg pain or new N/T/W. The patient remained afebrile with stable vital signs, and tolerated a regular diet. The patient continued to increase activities, and pain was well controlled with oral pain medications.   Consults: None  Significant Diagnostic Studies:  Results for orders placed or performed during the hospital encounter of 02/14/16  Surgical pcr screen  Result Value Ref Range   MRSA, PCR NEGATIVE NEGATIVE   Staphylococcus aureus NEGATIVE NEGATIVE  Basic metabolic panel  Result Value Ref Range   Sodium 142 135 - 145 mmol/L   Potassium 4.8 3.5 - 5.1 mmol/L   Chloride 110 101 - 111 mmol/L   CO2 24 22 - 32 mmol/L   Glucose, Bld 90 65 - 99 mg/dL   BUN 11 6 - 20 mg/dL   Creatinine, Ser 7.82 0.61 - 1.24 mg/dL   Calcium 9.4 8.9 - 95.6 mg/dL   GFR calc non Af Amer >60 >60 mL/min   GFR calc Af Amer >60 >60 mL/min   Anion gap 8 5 - 15  CBC WITH DIFFERENTIAL  Result Value Ref Range   WBC 5.3 4.0 - 10.5 K/uL   RBC 5.44 4.22 - 5.81 MIL/uL   Hemoglobin 14.5 13.0 - 17.0 g/dL   HCT 21.3 08.6 - 57.8 %   MCV 84.0 78.0 - 100.0 fL   MCH 26.7 26.0 - 34.0 pg   MCHC 31.7 30.0 - 36.0 g/dL   RDW 46.9 62.9 - 52.8 %   Platelets 212 150 - 400 K/uL   Neutrophils Relative % 47 %   Neutro Abs 2.6 1.7 - 7.7 K/uL   Lymphocytes  Relative 38 %   Lymphs Abs 2.0 0.7 - 4.0 K/uL   Monocytes Relative 10 %   Monocytes Absolute 0.5 0.1 - 1.0 K/uL   Eosinophils Relative 4 %   Eosinophils Absolute 0.2 0.0 - 0.7 K/uL   Basophils Relative 1 %   Basophils Absolute 0.0 0.0 - 0.1 K/uL  Protime-INR  Result Value Ref Range   Prothrombin Time 13.4 11.6 - 15.2 seconds   INR 1.00 0.00 - 1.49    Chest 2 View  02/14/2016  CLINICAL DATA:  Lumbar spinal stenosis.  Pre-op respiratory exam EXAM: CHEST  2 VIEW COMPARISON:  None. FINDINGS: The heart size and mediastinal contours are within normal limits. Both lungs are clear. No evidence of pneumothorax or pleural effusion. Thoracic spine degenerative changes noted. IMPRESSION: No active cardiopulmonary disease. Electronically Signed   By: Myles Rosenthal M.D.   On: 02/14/2016 15:23   Dg Lumbar Spine 2-3 Views  02/17/2016  CLINICAL DATA:  Opening films for L3-S1 bilateral laminectomy. EXAM: LUMBAR SPINE - 2-3 VIEW COMPARISON:  Lumbar spine MRI 10/02/2015 FINDINGS: Spinal numbering based on review of outside lumbar MRI 10/02/2015 - report not available. First lateral image of the lumbar spine shows a spinal needle overlapping the L5 spinous process. Retractor  was placed at this level and there are probes in the posterior soft tissues at the level of the L4-5 and L5-S1 interspaces. IMPRESSION: Intraoperative localization as described. Electronically Signed   By: Marnee Spring M.D.   On: 02/17/2016 13:54    Antibiotics:  Anti-infectives    Start     Dose/Rate Route Frequency Ordered Stop   02/17/16 2000  ceFAZolin (ANCEF) IVPB 1 g/50 mL premix     1 g 100 mL/hr over 30 Minutes Intravenous Every 8 hours 02/17/16 1542 02/18/16 0458   02/17/16 1357  vancomycin (VANCOCIN) powder  Status:  Discontinued       As needed 02/17/16 1357 02/17/16 1431   02/17/16 1355  vancomycin (VANCOCIN) 1000 MG powder    Comments:  Jeralyn Ruths   : cabinet override      02/17/16 1355 02/18/16 0159   02/17/16 1205   bacitracin 50,000 Units in sodium chloride irrigation 0.9 % 500 mL irrigation  Status:  Discontinued       As needed 02/17/16 1205 02/17/16 1431   02/17/16 0722  ceFAZolin (ANCEF) IVPB 2g/100 mL premix     2 g 200 mL/hr over 30 Minutes Intravenous On call to O.R. 02/17/16 0722 02/17/16 1209      Discharge Exam: Blood pressure 154/88, pulse 81, temperature 98.6 F (37 C), temperature source Oral, resp. rate 20, weight 109.317 kg (241 lb), SpO2 100 %. Neurologic: Alert and oriented X 3, normal strength and tone. Normal symmetric reflexes. Normal coordination and gait Dressing dry  Discharge Medications:     Medication List    TAKE these medications        ALLERGY RELIEF/NASAL DECONGEST 10-240 MG 24 hr tablet  Generic drug:  loratadine-pseudoephedrine  Take 1 tablet by mouth daily.     atorvastatin 20 MG tablet  Commonly known as:  LIPITOR  Take 20 mg by mouth daily.     baclofen 10 MG tablet  Commonly known as:  LIORESAL  Take 10 mg by mouth 3 (three) times daily.     clopidogrel 75 MG tablet  Commonly known as:  PLAVIX  Take 75 mg by mouth daily.     fluticasone 50 MCG/ACT nasal spray  Commonly known as:  FLONASE  Place 1 spray into both nostrils 2 (two) times daily.     gabapentin 100 MG capsule  Commonly known as:  NEURONTIN  Take 300 mg by mouth 3 (three) times daily.     ipratropium 0.06 % nasal spray  Commonly known as:  ATROVENT  Place 1 spray into both nostrils 2 (two) times daily.     labetalol 200 MG tablet  Commonly known as:  NORMODYNE  Take 200 mg by mouth 2 (two) times daily.     lisinopril 20 MG tablet  Commonly known as:  PRINIVIL,ZESTRIL  Take 20 mg by mouth daily.     oxyCODONE-acetaminophen 10-325 MG tablet  Commonly known as:  PERCOCET  Take 1 tablet by mouth every 4 (four) hours as needed for pain.     VITAMIN A PO  Take 1 tablet by mouth daily.     vitamin C 500 MG tablet  Commonly known as:  ASCORBIC ACID  Take 500 mg by mouth  daily.        Disposition: Home   Final Dx: L3-L5 decompressive laminectomy for stenosis      Discharge Instructions     Remove dressing in 72 hours    Complete by:  As directed  Call MD for:  difficulty breathing, headache or visual disturbances    Complete by:  As directed      Call MD for:  persistant nausea and vomiting    Complete by:  As directed      Call MD for:  redness, tenderness, or signs of infection (pain, swelling, redness, odor or green/yellow discharge around incision site)    Complete by:  As directed      Call MD for:  severe uncontrolled pain    Complete by:  As directed      Call MD for:  temperature >100.4    Complete by:  As directed      Diet - low sodium heart healthy    Complete by:  As directed      Discharge instructions    Complete by:  As directed   No bending or twisting or heavy lifting, may shower     Increase activity slowly    Complete by:  As directed            Follow-up Information    Follow up with Jaline Pincock S, MD. Schedule an appointment as soon as possible for a visit in 2 weeks.   Specialty:  Neurosurgery   Contact information:   [address] [place] [address] [phone]        Signed: Tia AlertJONES,Madhuri Vacca S 02/18/2016, 9:29 AM

## 2016-02-18 NOTE — Evaluation (Signed)
Occupational Therapy Evaluation Patient Details Name: KALDEN WANKE MRN: [id number] DOB: Mar 05, 1966 Today's Date: 02/18/2016    History of Present Illness 50 y/o male who presents s/p L3-S1 lumbar laminectomy/decompression on 02/17/16.   Clinical Impression   Patient evaluated by Occupational Therapy with no further acute OT needs identified. All education has been completed and the patient has no further questions. See below for any follow-up Occupational Therapy or equipment needs. OT to sign off. Thank you for referral.   Back handout in room and reviewed in detail    Follow Up Recommendations  No OT follow up    Equipment Recommendations  3 in 1 bedside comode    Recommendations for Other Services       Precautions / Restrictions Precautions Precautions: Fall;Back Precaution Booklet Issued: Yes (comment) Precaution Comments: Reviewed handout with pt and family. reviewed in detail the precautions with adls and home setup Restrictions Weight Bearing Restrictions: No      Mobility Bed Mobility Overal bed mobility: Modified Independent Bed Mobility: Rolling;Sidelying to Sit           General bed mobility comments: in chair on arrival  Transfers Overall transfer level: Needs assistance Equipment used: None Transfers: Sit to/from Stand Sit to Stand: Supervision         General transfer comment: Close superivison for safety as pt powered-up to full standing position. VC's for hand placement on seated surface for safety as pt performed stand>sit.     Balance Overall balance assessment: Needs assistance Sitting-balance support: Feet supported;No upper extremity supported Sitting balance-Leahy Scale: Good     Standing balance support: No upper extremity supported;During functional activity Standing balance-Leahy Scale: Fair                              ADL Overall ADL's : Needs assistance/impaired Eating/Feeding: Independent;Sitting    Grooming: Wash/dry hands;Wash/dry face;Independent               Lower Body Dressing: Min guard;Sit to/from stand Lower Body Dressing Details (indicate cue type and reason): able to cross bil LE               General ADL Comments: Pt educated on back precautions with daily routine. Pt expressed concerns with contacting CM that is in [place]. Pt and OT discussed terms and communicating needs for follow up. MD arriving during session and again repeating information provided by OT. Pt educated on seating in home and how to make it properly fit patient for d/c     Vision Vision Assessment?: No apparent visual deficits   Perception     Praxis      Pertinent Vitals/Pain Pain Assessment: Faces Faces Pain Scale: Hurts little more Pain Location: surgerical Pain Descriptors / Indicators: Operative site guarding Pain Intervention(s): Monitored during session;Premedicated before session;Repositioned     Hand Dominance Right   Extremity/Trunk Assessment Upper Extremity Assessment Upper Extremity Assessment: Overall WFL for tasks assessed   Lower Extremity Assessment Lower Extremity Assessment: Defer to PT evaluation   Cervical / Trunk Assessment Cervical / Trunk Assessment: Other exceptions Cervical / Trunk Exceptions: Lumbar surgical incision noted.    Communication Communication Communication: No difficulties   Cognition Arousal/Alertness: Awake/alert Behavior During Therapy: WFL for tasks assessed/performed Overall Cognitive Status: Within Functional Limits for tasks assessed                     General Comments  Exercises       Shoulder Instructions      Home Living Family/patient expects to be discharged to:: Private residence Living Arrangements: Non-relatives/Friends Available Help at Discharge: Family;Available 24 hours/day Type of Home: House Home Access: Level entry     Home Layout: One level     Bathroom Shower/Tub: Retail bankerWalk-in  shower   Bathroom Toilet: Standard Bathroom Accessibility: Yes   Home Equipment: Shower seat - built in;Cane - single point   Additional Comments: Pt is anticipating home with parents for a few days (info above is for parents house). When pt returns home he will have 4 steps x3 to enter home and then a flight of stairs to get to bedroom/bathroom.       Prior Functioning/Environment Level of Independence: Independent             OT Diagnosis: Generalized weakness;Acute pain   OT Problem List:     OT Treatment/Interventions:      OT Goals(Current goals can be found in the care plan section) Acute Rehab OT Goals Patient Stated Goal: Home when able  OT Frequency:     Barriers to D/C:            Co-evaluation              End of Session Nurse Communication: Mobility status;Precautions  Activity Tolerance: Patient tolerated treatment well Patient left: in chair;with call bell/phone within reach;with family/visitor present   Time: [id number] OT Time Calculation (min): 27 min Charges:  OT General Charges $OT Visit: 1 Procedure OT Evaluation $OT Eval Moderate Complexity: 1 Procedure G-Codes:    Boone MasterJones, Lilie Vezina B 02/18/2016, 10:21 AM  Mateo FlowJones, Brynn   OTR/L Pager: [phone]: [phone] Office: [phone] .

## 2016-02-18 NOTE — Discharge Instructions (Signed)

## 2016-02-18 NOTE — Progress Notes (Signed)
Pt doing well. Pt and family given D/C instructions with Rx's, verbal understanding was provided. Pt's IV and Hemovac were removed prior to D/C. Pt's incision is clean and dry with no sign of infection. Pt D/C'd home via wheelchair @ 1200 per MD order. Pt is stable @ D/C and has no other needs at this time. Rema FendtAshley Joseluis Alessio, RN

## 2016-02-18 NOTE — Evaluation (Signed)
Physical Therapy Evaluation Patient Details Name: Leonard Herrera MRN: [id number] DOB: 04/06/1966 Today's Date: 02/18/2016   History of Present Illness  Pt is a 50 y/o male who presents s/p L3-S1 lumbar laminectomy/decompression on 02/17/16.  Clinical Impression  Pt admitted with above diagnosis. Pt currently with functional limitations due to the deficits listed below (see PT Problem List). At the time of PT eval pt was able to perform transfers and ambulation with min guard to supervision for safety. Pt reports plan to stay with parents for a short time until he feels he can manage with roommate at home. Pt did demonstrate the ability to negotiate the stairs well today.  Pt will benefit from skilled PT to increase their independence and safety with mobility to allow discharge to the venue listed below.       Follow Up Recommendations Outpatient PT;Supervision for mobility/OOB    Equipment Recommendations  3in1 (PT)    Recommendations for Other Services       Precautions / Restrictions Precautions Precautions: Fall;Back Precaution Booklet Issued: Yes (comment) Precaution Comments: Reviewed handout with pt and family. Pt was cued for precautions during functional mobility. Restrictions Weight Bearing Restrictions: No      Mobility  Bed Mobility Overal bed mobility: Modified Independent Bed Mobility: Rolling;Sidelying to Sit           General bed mobility comments: VC's for sequencing and proper technique for log roll. Overall mod I with HOB flat and rails lowered to simulate home environment.   Transfers Overall transfer level: Needs assistance Equipment used: None Transfers: Sit to/from Stand Sit to Stand: Supervision         General transfer comment: Close superivison for safety as pt powered-up to full standing position. VC's for hand placement on seated surface for safety as pt performed stand>sit.   Ambulation/Gait Ambulation/Gait assistance:  Supervision Ambulation Distance (Feet): 300 Feet Assistive device: None Gait Pattern/deviations: Step-through pattern;Decreased stride length Gait velocity: Decreased Gait velocity interpretation: Below normal speed for age/gender General Gait Details: Slow and guarded gait however no unsteadiness noted. Pt reports that he is making an effort to not twist his hips when he is walking as that is how he has had to walk recently to avoid pain.   Stairs Stairs: Yes Stairs assistance: Min guard Stair Management: One rail Left;Step to pattern;Forwards Number of Stairs: 10 General stair comments: Close guard for safety as well as VC's for sequencing/technique.   Wheelchair Mobility    Modified Rankin (Stroke Patients Only)       Balance Overall balance assessment: Needs assistance Sitting-balance support: Feet supported;No upper extremity supported Sitting balance-Leahy Scale: Good     Standing balance support: No upper extremity supported;During functional activity Standing balance-Leahy Scale: Fair                               Pertinent Vitals/Pain Pain Assessment: Faces Faces Pain Scale: Hurts little more Pain Location: Incision Pain Descriptors / Indicators: Operative site guarding;Discomfort Pain Intervention(s): Limited activity within patient's tolerance;Monitored during session;Repositioned    Home Living Family/patient expects to be discharged to:: Private residence Living Arrangements: Non-relatives/Friends Available Help at Discharge: Family;Available 24 hours/day Type of Home: House Home Access: Level entry     Home Layout: One level Home Equipment: Shower seat - built in;Cane - single point Additional Comments: Pt is anticipating home with parents for a few days (info above is for parents house). When pt returns  home he will have 4 steps x3 to enter home and then a flight of stairs to get to bedroom/bathroom.     Prior Function Level of  Independence: Independent               Hand Dominance        Extremity/Trunk Assessment   Upper Extremity Assessment: Defer to OT evaluation           Lower Extremity Assessment: Overall WFL for tasks assessed      Cervical / Trunk Assessment: Other exceptions  Communication   Communication: No difficulties  Cognition Arousal/Alertness: Awake/alert Behavior During Therapy: WFL for tasks assessed/performed Overall Cognitive Status: Within Functional Limits for tasks assessed                      General Comments      Exercises        Assessment/Plan    PT Assessment Patient needs continued PT services  PT Diagnosis Difficulty walking;Acute pain   PT Problem List Decreased strength;Decreased range of motion;Decreased activity tolerance;Decreased balance;Decreased mobility;Decreased knowledge of use of DME;Decreased safety awareness;Decreased knowledge of precautions;Pain  PT Treatment Interventions DME instruction;Gait training;Stair training;Functional mobility training;Therapeutic activities;Therapeutic exercise;Neuromuscular re-education;Patient/family education   PT Goals (Current goals can be found in the Care Plan section) Acute Rehab PT Goals Patient Stated Goal: Home when able PT Goal Formulation: With patient/family Time For Goal Achievement: 02/25/16 Potential to Achieve Goals: Good    Frequency Min 5X/week   Barriers to discharge        Co-evaluation               End of Session   Activity Tolerance: Patient tolerated treatment well Patient left: in chair;with call bell/phone within reach;with family/visitor present Nurse Communication: Mobility status    Functional Assessment Tool Used: Clinical judgement Functional Limitation: Mobility: Walking and moving around Mobility: Walking and Moving Around Current Status ([id number](G8978): At least 1 percent but less than 20 percent impaired, limited or restricted Mobility: Walking and  Moving Around Goal Status [phone]([id number]): At least 1 percent but less than 20 percent impaired, limited or restricted    Time: 0804-0832 PT Time Calculation (min) (ACUTE ONLY): 28 min   Charges:   PT Evaluation $PT Eval Moderate Complexity: 1 Procedure PT Treatments $Gait Training: 8-22 mins   PT G Codes:   PT G-Codes **NOT FOR INPATIENT CLASS** Functional Assessment Tool Used: Clinical judgement Functional Limitation: Mobility: Walking and moving around Mobility: Walking and Moving Around Current Status (N0272(G8978): At least 1 percent but less than 20 percent impaired, limited or restricted Mobility: Walking and Moving Around Goal Status [phone](G8979): At least 1 percent but less than 20 percent impaired, limited or restricted    Conni SlipperKirkman, Jerzy Roepke 02/18/2016, 8:56 AM   Conni SlipperLaura Budd [place], PT, DPT Acute Rehabilitation Services Pager: [phone]

## 2017-01-19 ENCOUNTER — Other Ambulatory Visit: Payer: Self-pay | Admitting: Neurological Surgery

## 2017-01-19 DIAGNOSIS — M542 Cervicalgia: Secondary | ICD-10-CM

## 2017-01-28 ENCOUNTER — Ambulatory Visit
Admission: RE | Admit: 2017-01-28 | Discharge: 2017-01-28 | Disposition: A | Discharge status: Home / Self Care | Payer: Worker's Compensation | Source: Ambulatory Visit | Attending: Neurological Surgery | Admitting: Neurological Surgery

## 2017-01-28 DIAGNOSIS — M542 Cervicalgia: Secondary | ICD-10-CM

## 2018-01-04 ENCOUNTER — Other Ambulatory Visit: Payer: Self-pay | Admitting: Neurological Surgery

## 2018-01-04 DIAGNOSIS — M4802 Spinal stenosis, cervical region: Secondary | ICD-10-CM

## 2018-01-11 ENCOUNTER — Ambulatory Visit
Admission: RE | Admit: 2018-01-11 | Discharge: 2018-01-11 | Disposition: A | Discharge status: Home / Self Care | Payer: Medicaid Other | Source: Ambulatory Visit | Attending: Neurological Surgery | Admitting: Neurological Surgery

## 2018-01-11 DIAGNOSIS — M4802 Spinal stenosis, cervical region: Secondary | ICD-10-CM

## 2018-01-30 ENCOUNTER — Other Ambulatory Visit: Payer: Self-pay | Admitting: Neurological Surgery

## 2018-02-26 NOTE — Progress Notes (Signed)
Leonard Herrera states that he did not know he had a  Date for surgery, "I want to wait until July. " "My mom is having surgery next week and I want to help her."  I instructed patient to call Erie Noe in am and speak with her, he said he will.

## 2018-04-06 IMAGING — CR DG CHEST 2V
2 series · 2 of 2 positions shown · non-contrast
Comparison: None.

CLINICAL DATA: Lumbar spinal stenosis.  Pre-op respiratory exam

EXAM:
CHEST  2 VIEW

[w chest pa]
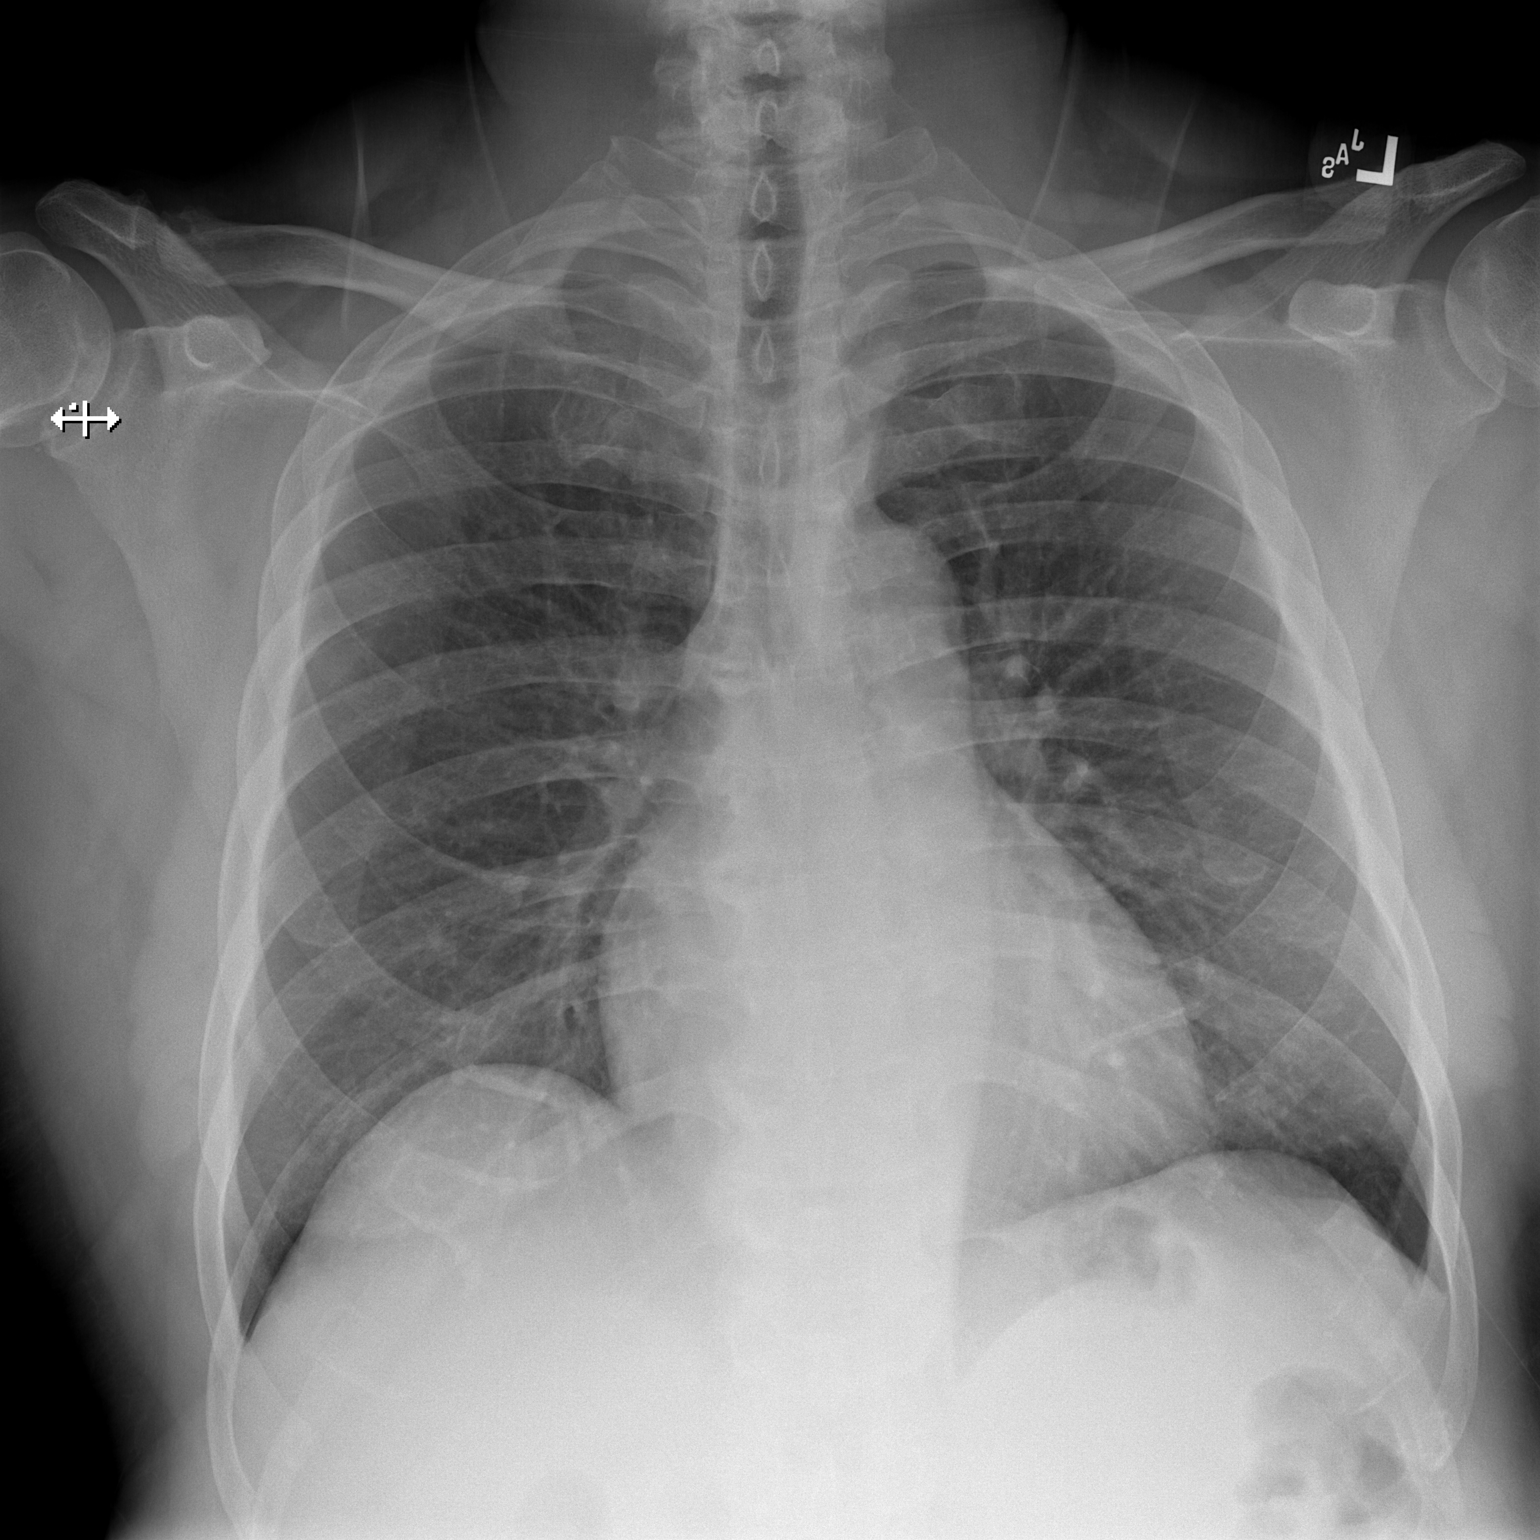

[w chest lat]
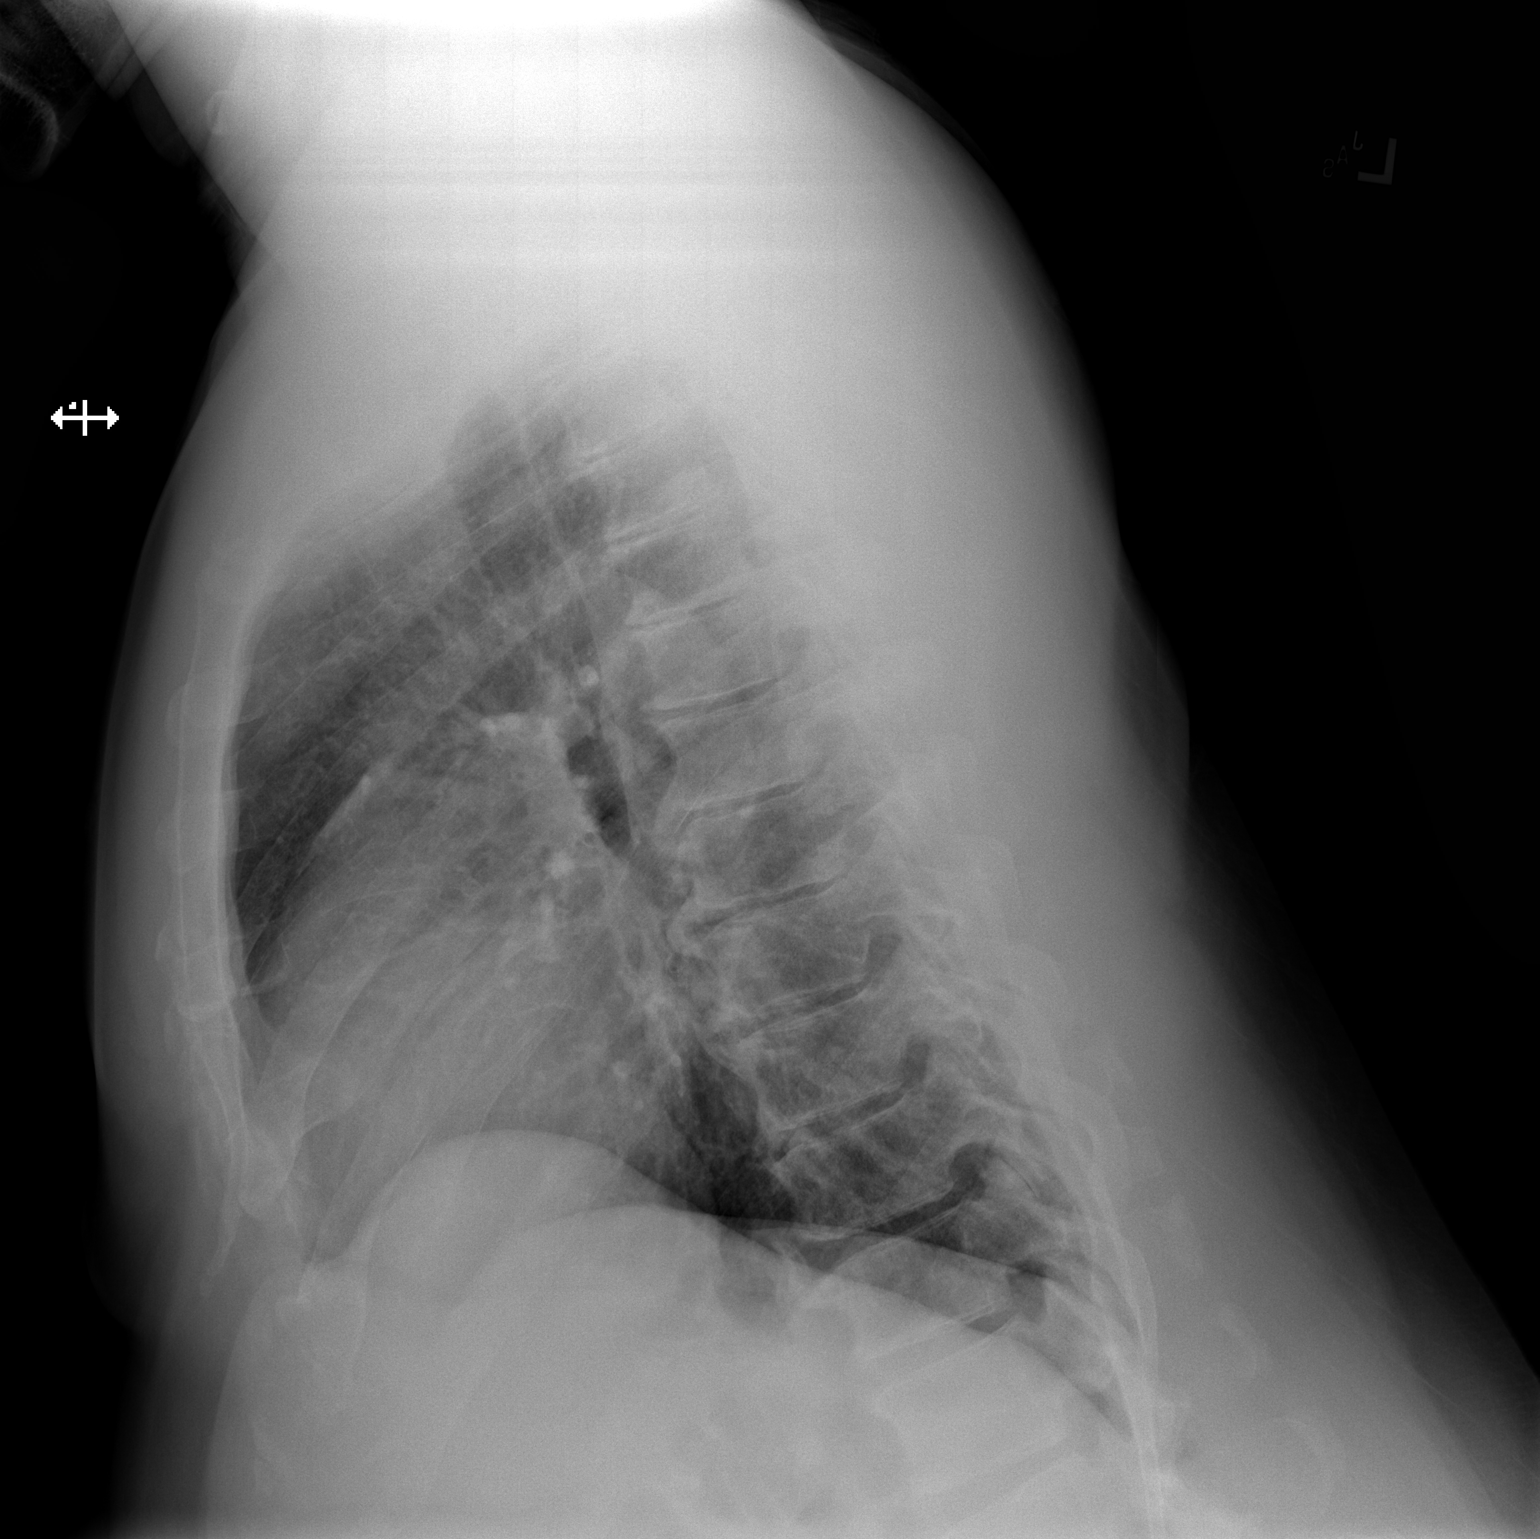

[2 of 2 positions shown; findings below may reference images not displayed]

FINDINGS: The heart size and mediastinal contours are within normal limits.
Both lungs are clear. No evidence of pneumothorax or pleural
effusion. Thoracic spine degenerative changes noted.
IMPRESSION: No active cardiopulmonary disease.

## 2018-04-29 NOTE — Pre-Procedure Instructions (Signed)
Leonard Herrera  04/29/2018      [id number] - [place], [name] - [address] [place] [address] Phone: [phone] Fax: [phone]    Your procedure is scheduled on May 08, 2018.  [facility] at 745 AM.  Call this number if you have problems the morning of surgery:  [phone]   Remember:  Do not eat or drink after midnight.    Take these medicines the morning of surgery with A SIP OF WATER  Labetalol (normaodyne) Atrovent nasal spray-if needed Percocet-if needed lyrica robaxin Visine eye drops-if needed  Follow your surgeon's instructions on when to hold/resume aspirin 81 mg.  7 days prior to surgery STOP taking any Aleve, Naproxen, Ibuprofen, Motrin, Advil, Goody's, BC's, all herbal medications, fish oil, and all vitamins   Do not wear jewelry  Do not wear lotions, powders, or colognes, or deodorant.  Men may shave face and neck.  Do not bring valuables to the hospital.  [facility] is not responsible for any belongings or valuables.  Contacts, dentures or bridgework may not be worn into surgery.  Leave your suitcase in the car.  After surgery it may be brought to your room.  For patients admitted to the hospital, discharge time will be determined by your treatment team.  Patients discharged the day of surgery will not be allowed to drive home.    - Preparing For Surgery  Before surgery, you can play an important role. Because skin is not sterile, your skin needs to be as free of germs as possible. You can reduce the number of germs on your skin by washing with CHG (chlorahexidine gluconate) Soap before surgery.  CHG is an antiseptic cleaner which kills germs and bonds with the skin to continue killing germs even after washing.    Oral Hygiene is also important to reduce your risk of infection.  Remember - BRUSH YOUR TEETH THE MORNING OF SURGERY WITH YOUR REGULAR  TOOTHPASTE  Please do not use if you have an allergy to CHG or antibacterial soaps. If your skin becomes reddened/irritated stop using the CHG.  Do not shave (including legs and underarms) for at least 48 hours prior to first CHG shower. It is OK to shave your face.  Please follow these instructions carefully.   1. Shower the NIGHT BEFORE SURGERY and the MORNING OF SURGERY with CHG.   2. If you chose to wash your hair, wash your hair first as usual with your normal shampoo.  3. After you shampoo, rinse your hair and body thoroughly to remove the shampoo.  4. Use CHG as you would any other liquid soap. You can apply CHG directly to the skin and wash gently with a scrungie or a clean washcloth.   5. Apply the CHG Soap to your body ONLY FROM THE NECK DOWN.  Do not use on open wounds or open sores. Avoid contact with your eyes, ears, mouth and genitals (private parts). Wash Face and genitals (private parts)  with your normal soap.  6. Wash thoroughly, paying special attention to the area where your surgery will be performed.  7. Thoroughly rinse your body with warm water from the neck down.  8. DO NOT shower/wash with your normal soap after using and rinsing off the CHG Soap.  9. Pat yourself dry with a CLEAN TOWEL.  10. Wear CLEAN PAJAMAS to bed the night before surgery, wear comfortable  clothes the morning of surgery  11. Place CLEAN SHEETS on your bed the night of your first shower and DO NOT SLEEP WITH PETS.  Day of Surgery:  Do not apply any deodorants/lotions.  Please wear clean clothes to the hospital/surgery center.   Remember to brush your teeth WITH YOUR REGULAR TOOTHPASTE.  Please read over the following fact sheets that you were given. Pain Booklet, Coughing and Deep Breathing, MRSA Information and Surgical Site Infection Prevention

## 2018-04-30 ENCOUNTER — Encounter (HOSPITAL_COMMUNITY)
Admission: RE | Admit: 2018-04-30 | Discharge: 2018-04-30 | Disposition: A | Discharge status: Home / Self Care | Payer: Medicaid Other | Source: Ambulatory Visit | Attending: Neurological Surgery | Admitting: Neurological Surgery

## 2018-04-30 ENCOUNTER — Encounter (HOSPITAL_COMMUNITY): Payer: Self-pay

## 2018-04-30 ENCOUNTER — Ambulatory Visit (HOSPITAL_COMMUNITY)
Admission: RE | Admit: 2018-04-30 | Discharge: 2018-04-30 | Disposition: A | Discharge status: Home / Self Care | Payer: Medicaid Other | Source: Ambulatory Visit | Attending: Neurological Surgery | Admitting: Neurological Surgery

## 2018-04-30 ENCOUNTER — Other Ambulatory Visit: Payer: Self-pay

## 2018-04-30 DIAGNOSIS — M4802 Spinal stenosis, cervical region: Secondary | ICD-10-CM | POA: Diagnosis present

## 2018-04-30 DIAGNOSIS — Z01818 Encounter for other preprocedural examination: Secondary | ICD-10-CM | POA: Diagnosis present

## 2018-04-30 DIAGNOSIS — R9431 Abnormal electrocardiogram [ECG] [EKG]: Secondary | ICD-10-CM | POA: Insufficient documentation

## 2018-04-30 HISTORY — DX: Localized edema: R60.0

## 2018-04-30 LAB — CBC WITH DIFFERENTIAL/PLATELET
ABS IMMATURE GRANULOCYTES: 0 10*3/uL (ref 0.0–0.1)
BASOS ABS: 0.1 10*3/uL (ref 0.0–0.1)
BASOS PCT: 1 %
Eosinophils Absolute: 0.3 10*3/uL (ref 0.0–0.7)
Eosinophils Relative: 6 %
HCT: 48.9 % (ref 39.0–52.0)
Hemoglobin: 15 g/dL (ref 13.0–17.0)
IMMATURE GRANULOCYTES: 0 %
LYMPHS PCT: 35 %
Lymphs Abs: 1.7 10*3/uL (ref 0.7–4.0)
MCH: 26.4 pg (ref 26.0–34.0)
MCHC: 30.7 g/dL (ref 30.0–36.0)
MCV: 85.9 fL (ref 78.0–100.0)
Monocytes Absolute: 0.5 10*3/uL (ref 0.1–1.0)
Monocytes Relative: 11 %
NEUTROS PCT: 47 %
Neutro Abs: 2.3 10*3/uL (ref 1.7–7.7)
PLATELETS: 183 10*3/uL (ref 150–400)
RBC: 5.69 MIL/uL (ref 4.22–5.81)
RDW: 15.2 % (ref 11.5–15.5)
WBC: 4.8 10*3/uL (ref 4.0–10.5)

## 2018-04-30 LAB — COMPREHENSIVE METABOLIC PANEL
ALT: 20 U/L (ref 0–44)
AST: 26 U/L (ref 15–41)
Albumin: 3.9 g/dL (ref 3.5–5.0)
Alkaline Phosphatase: 76 U/L (ref 38–126)
Anion gap: 8 (ref 5–15)
BILIRUBIN TOTAL: 0.6 mg/dL (ref 0.3–1.2)
BUN: 10 mg/dL (ref 6–20)
CHLORIDE: 109 mmol/L (ref 98–111)
CO2: 26 mmol/L (ref 22–32)
Calcium: 9.3 mg/dL (ref 8.9–10.3)
Creatinine, Ser: 1.23 mg/dL (ref 0.61–1.24)
GFR calc Af Amer: >60 mL/min (ref >60)
GFR calc non Af Amer: >60 mL/min (ref >60)
GLUCOSE: 89 mg/dL (ref 70–99)
POTASSIUM: 4.4 mmol/L (ref 3.5–5.1)
Sodium: 143 mmol/L (ref 135–145)
Total Protein: 6.5 g/dL (ref 6.5–8.1)

## 2018-04-30 LAB — TYPE AND SCREEN
ABO/RH(D): O POS
Antibody Screen: NEGATIVE

## 2018-04-30 LAB — PROTIME-INR
INR: 1
PROTHROMBIN TIME: 13.1 s (ref 11.4–15.2)

## 2018-04-30 LAB — SURGICAL PCR SCREEN
MRSA, PCR: NEGATIVE
STAPHYLOCOCCUS AUREUS: NEGATIVE

## 2018-04-30 LAB — ABO/RH: ABO/RH(D): O POS

## 2018-04-30 NOTE — Progress Notes (Signed)
PATIENT STATED HE WAS INSTRUCTED BY CARDIOLOGIST AT [facility] TO CONTINUE ASPIRIN AND STOP PLAVIX 5 DAYS PRIOR TO SURGERY.  (LAST DOSE PLAVIX WILL BE 05/03/18)

## 2018-05-01 NOTE — Progress Notes (Signed)
Anesthesia Chart Review:  Case:  [id number] Date/Time:  05/08/18 0930   Procedure:  ACDF - C3-C4 - C4-C5 - C5-C6 - C6-C7 (N/A )   Anesthesia type:  General   Pre-op diagnosis:  Stenosis   Location:  [facility] ROOM 18 / [facility]   Surgeon:  Tia Alert, MD      DISCUSSION:Patient is a 52 year old male former smoker scheduled for the above procedure. Pertinent medical history includes HTN, CAD/STEMI X 2 s/p DES to pLAD '04 with subsequent in-stent thrombosis '10 s/p thrombectomy and DES to mLAD, dyslipidemia, OSA (no CPAP currently).  On 09/10/2017 he had an abnormal stress test. He followed up with cardiology 05/07/2018 and was cleared by Dr. Therisa Doyne (in care everywhere) stating "Patient is stable from CV status and has stopped Plavix in anticipation of his neck surgery, He is cleared for upcoming operation tomorrow."   VS: BP 131/79   Pulse 71   Temp 37.4 C   Resp 20   Ht 5\' 9"  (1.753 m)   Wt 244 lb 1.6 oz (110.7 kg)   SpO2 99%   BMI 36.05 kg/m   PROVIDERS: Jeanie Cooks, DO is PCP last seen 04/01/2018 per note stable chronic medical management  Loney Loh, MD is Cardiologist last seen 05/07/2018   LABS: Labs reviewed: Acceptable for surgery. (all labs ordered are listed, but only abnormal results are displayed)  Labs Reviewed  SURGICAL PCR SCREEN  CBC WITH DIFFERENTIAL/PLATELET  PROTIME-INR  COMPREHENSIVE METABOLIC PANEL  TYPE AND SCREEN  ABO/RH     IMAGES: 02/14/16 CXR: IMPRESSION: No active cardiopulmonary disease.  EKG: 04/30/2018: NSR. Nonspecific T wave abnormality.  03/23/16: [facility]): Sinus rhythm. Anteroseptal infarct (cited on or before 13-Oct-2014) When compared with ECG of 14-Dec-2015 Questionable change in initial forces of anterior leads  CV: Stress Test 09/10/2017 [facility]; Care Everywhere): FINDINGS: Physiological distribution of the radiotracer. No unexpected findings on raw images. No transient ischemic dilatation.  Fixed perfusion defect in  the apex, mid and distal anterior wall, distal anteroseptal wall, with mild peri-infarct reversible ischemia at the distal anterolateral wall.  Gated images: End diastolic volume: 914 cc End systolic volume: 86 cc Ejection fraction: 49%. Wall motion abnormalities: Akinesis of the apex. Mild global hypokinesia.  Echo 01/14/16 [facility]; Care Everywhere): SUMMARY There is moderate concentric left ventricular hypertrophy. The left ventricular size is normal. Left ventricular systolic function is normal. LV ejection fraction = 55-60%. There are regional wall motion abnormalities as specified below. Mild mid to distal anterior and anteroseptal hypokinesis IVC size was normal. The right ventricle is normal in size and function. The left atrium is mildly dilated. No significant stenosis or regurgitation seen (trace MR/TR/PR) There is no pericardial effusion.  Past Medical History:  Diagnosis Date  . Acid indigestion   . Anxiety   . Conductive hearing loss   . Coronary artery disease   . Depression   . Dyslipidemia   . Edema extremities    WITH  CERTAIN FOOD  . History of bronchitis   . Hypertension   . Impingement syndrome of shoulder    right shoulder  . Lumbar spinal stenosis   . Myocardial infarction [facility]) 2004   x2  . Neuropathy   . Nocturia   . Sleep apnea    diagnosed 02/10/2016 - does not have CPAP at this time    Past Surgical History:  Procedure Laterality Date  . CARDIAC CATHETERIZATION     with stent placement  .  COLONOSCOPY    . CORONARY ANGIOPLASTY    . HERNIA REPAIR     "as an infant"  . LUMBAR LAMINECTOMY/DECOMPRESSION MICRODISCECTOMY Bilateral 02/17/2016   Procedure: Laminectomy and Foraminotomy - L3-L4 - L4-L5 - L5-S1 - bilateral;  Surgeon: Tia Alertavid S Jones, MD;  Location: [facility] NEURO [facility]S;  Service: Neurosurgery;  Laterality: Bilateral;  Laminectomy and Foraminotomy - L3-L4 - L4-L5 - L5-S1 - bilateral    MEDICATIONS: . cetirizine (ZYRTEC) 5 MG tablet  .  Multiple Vitamin (MULTIVITAMIN) tablet  . triamcinolone lotion (KENALOG) 0.1 %  . aspirin EC 81 MG tablet  . atorvastatin (LIPITOR) 20 MG tablet  . clopidogrel (PLAVIX) 75 MG tablet  . fluticasone (FLONASE) 50 MCG/ACT nasal spray  . ipratropium (ATROVENT) 0.06 % nasal spray  . labetalol (NORMODYNE) 300 MG tablet  . lisinopril (PRINIVIL,ZESTRIL) 40 MG tablet  . magnesium oxide (MAG-OX) 400 MG tablet  . methocarbamol (ROBAXIN) 750 MG tablet  . oxyCODONE-acetaminophen (PERCOCET) 10-325 MG tablet  . pregabalin (LYRICA) 300 MG capsule  . Tetrahydrozoline HCl (VISINE OP)   No current facility-administered medications for this encounter.     Zannie CoveJames Burns, PA-C [facility] Phone [phone] 05/07/2018 4:22 PM

## 2018-05-08 ENCOUNTER — Other Ambulatory Visit: Payer: Self-pay

## 2018-05-08 ENCOUNTER — Inpatient Hospital Stay (HOSPITAL_COMMUNITY): Payer: Medicaid Other | Admitting: Certified Registered Nurse Anesthetist

## 2018-05-08 ENCOUNTER — Encounter (HOSPITAL_COMMUNITY): Payer: Self-pay | Admitting: Certified Registered Nurse Anesthetist

## 2018-05-08 ENCOUNTER — Inpatient Hospital Stay (HOSPITAL_COMMUNITY): Payer: Medicaid Other

## 2018-05-08 ENCOUNTER — Inpatient Hospital Stay (HOSPITAL_COMMUNITY)
Admission: RE | Admit: 2018-05-08 | Discharge: 2018-05-09 | DRG: 473 | Disposition: A | Discharge status: Home / Self Care | Payer: Medicaid Other | Source: Ambulatory Visit | Attending: Neurological Surgery | Admitting: Neurological Surgery

## 2018-05-08 ENCOUNTER — Inpatient Hospital Stay (HOSPITAL_COMMUNITY)
Admission: RE | Disposition: A | Discharge status: Home / Self Care | Payer: Self-pay | Source: Ambulatory Visit | Attending: Neurological Surgery

## 2018-05-08 DIAGNOSIS — I10 Essential (primary) hypertension: Secondary | ICD-10-CM | POA: Diagnosis present

## 2018-05-08 DIAGNOSIS — E785 Hyperlipidemia, unspecified: Secondary | ICD-10-CM | POA: Diagnosis present

## 2018-05-08 DIAGNOSIS — Z87891 Personal history of nicotine dependence: Secondary | ICD-10-CM

## 2018-05-08 DIAGNOSIS — I2510 Atherosclerotic heart disease of native coronary artery without angina pectoris: Secondary | ICD-10-CM | POA: Diagnosis present

## 2018-05-08 DIAGNOSIS — Z7951 Long term (current) use of inhaled steroids: Secondary | ICD-10-CM

## 2018-05-08 DIAGNOSIS — M47812 Spondylosis without myelopathy or radiculopathy, cervical region: Secondary | ICD-10-CM | POA: Diagnosis present

## 2018-05-08 DIAGNOSIS — I252 Old myocardial infarction: Secondary | ICD-10-CM

## 2018-05-08 DIAGNOSIS — F329 Major depressive disorder, single episode, unspecified: Secondary | ICD-10-CM | POA: Diagnosis present

## 2018-05-08 DIAGNOSIS — M5021 Other cervical disc displacement,  high cervical region: Secondary | ICD-10-CM | POA: Diagnosis present

## 2018-05-08 DIAGNOSIS — F419 Anxiety disorder, unspecified: Secondary | ICD-10-CM | POA: Diagnosis present

## 2018-05-08 DIAGNOSIS — Z419 Encounter for procedure for purposes other than remedying health state, unspecified: Secondary | ICD-10-CM

## 2018-05-08 DIAGNOSIS — Z79899 Other long term (current) drug therapy: Secondary | ICD-10-CM

## 2018-05-08 DIAGNOSIS — Z7982 Long term (current) use of aspirin: Secondary | ICD-10-CM

## 2018-05-08 DIAGNOSIS — M4322 Fusion of spine, cervical region: Secondary | ICD-10-CM | POA: Diagnosis present

## 2018-05-08 DIAGNOSIS — G4730 Sleep apnea, unspecified: Secondary | ICD-10-CM | POA: Diagnosis present

## 2018-05-08 DIAGNOSIS — M4802 Spinal stenosis, cervical region: Principal | ICD-10-CM | POA: Diagnosis present

## 2018-05-08 DIAGNOSIS — Z9861 Coronary angioplasty status: Secondary | ICD-10-CM

## 2018-05-08 DIAGNOSIS — Z7902 Long term (current) use of antithrombotics/antiplatelets: Secondary | ICD-10-CM

## 2018-05-08 HISTORY — PX: ANTERIOR CERVICAL DECOMPRESSION/DISCECTOMY FUSION 4 LEVELS: SHX5556

## 2018-05-08 SURGERY — ANTERIOR CERVICAL DECOMPRESSION/DISCECTOMY FUSION 4 LEVELS
Anesthesia: General | Site: Spine Cervical

## 2018-05-08 MED ORDER — MEPERIDINE HCL 50 MG/ML IJ SOLN: 6.2500 mg | INTRAMUSCULAR | Status: DC | PRN

## 2018-05-08 MED ORDER — LISINOPRIL 20 MG PO TABS
40.0000 mg | ORAL_TABLET | Freq: Every day | ORAL | Status: DC
  Administered 2018-05-08 – 2018-05-09 (×2): 40 mg via ORAL
  Filled 2018-05-08 (×2): qty 2

## 2018-05-08 MED ORDER — HYDROMORPHONE HCL 1 MG/ML IJ SOLN
0.5000 mg | INTRAMUSCULAR | Status: DC | PRN
  Administered 2018-05-08: 0.5 mg via INTRAVENOUS
  Filled 2018-05-08: qty 0.5

## 2018-05-08 MED ORDER — LACTATED RINGERS IV SOLN: INTRAVENOUS | Status: DC

## 2018-05-08 MED ORDER — DEXAMETHASONE 4 MG PO TABS
4.0000 mg | ORAL_TABLET | Freq: Four times a day (QID) | ORAL | Status: DC
  Administered 2018-05-08 – 2018-05-09 (×3): 4 mg via ORAL
  Filled 2018-05-08 (×3): qty 1

## 2018-05-08 MED ORDER — THROMBIN 5000 UNITS EX SOLR
CUTANEOUS | Status: AC
  Filled 2018-05-08: qty 5000

## 2018-05-08 MED ORDER — LABETALOL HCL 300 MG PO TABS
300.0000 mg | ORAL_TABLET | Freq: Two times a day (BID) | ORAL | Status: DC
  Administered 2018-05-08 – 2018-05-09 (×2): 300 mg via ORAL
  Filled 2018-05-08 (×2): qty 1

## 2018-05-08 MED ORDER — FENTANYL CITRATE (PF) 250 MCG/5ML IJ SOLN
INTRAMUSCULAR | Status: AC
  Filled 2018-05-08: qty 5

## 2018-05-08 MED ORDER — OXYCODONE HCL 5 MG PO TABS
5.0000 mg | ORAL_TABLET | ORAL | Status: DC | PRN
Start: 2018-05-08 — End: 2018-05-09
  Administered 2018-05-08 – 2018-05-09 (×5): 5 mg via ORAL
  Filled 2018-05-08 (×5): qty 1

## 2018-05-08 MED ORDER — ROCURONIUM BROMIDE 10 MG/ML (PF) SYRINGE
PREFILLED_SYRINGE | INTRAVENOUS | Status: DC | PRN
  Administered 2018-05-08: 30 mg via INTRAVENOUS
  Administered 2018-05-08: 50 mg via INTRAVENOUS
  Administered 2018-05-08: 20 mg via INTRAVENOUS
  Administered 2018-05-08: 50 mg via INTRAVENOUS

## 2018-05-08 MED ORDER — ACETAMINOPHEN 325 MG PO TABS: 650.0000 mg | ORAL_TABLET | ORAL | Status: DC | PRN

## 2018-05-08 MED ORDER — LORATADINE 10 MG PO TABS
10.0000 mg | ORAL_TABLET | Freq: Every day | ORAL | Status: DC
  Administered 2018-05-09: 10 mg via ORAL
  Filled 2018-05-08 (×2): qty 1

## 2018-05-08 MED ORDER — CEFAZOLIN SODIUM-DEXTROSE 2-4 GM/100ML-% IV SOLN
2.0000 g | INTRAVENOUS | Status: AC
  Administered 2018-05-08: 2 g via INTRAVENOUS

## 2018-05-08 MED ORDER — SODIUM CHLORIDE 0.9 % IV SOLN
INTRAVENOUS | Status: DC | PRN
  Administered 2018-05-08: 500 mL

## 2018-05-08 MED ORDER — PROPOFOL 10 MG/ML IV BOLUS
INTRAVENOUS | Status: AC
  Filled 2018-05-08: qty 20

## 2018-05-08 MED ORDER — OXYCODONE-ACETAMINOPHEN 10-325 MG PO TABS
1.0000 | ORAL_TABLET | ORAL | Status: DC | PRN
Start: 2018-05-08 — End: 2018-05-08

## 2018-05-08 MED ORDER — PREGABALIN 75 MG PO CAPS
300.0000 mg | ORAL_CAPSULE | Freq: Two times a day (BID) | ORAL | Status: DC
Start: 2018-05-08 — End: 2018-05-09
  Administered 2018-05-08 – 2018-05-09 (×2): 300 mg via ORAL
  Filled 2018-05-08 (×2): qty 4

## 2018-05-08 MED ORDER — DEXTROSE 5 % IV SOLN
INTRAVENOUS | Status: DC | PRN
  Administered 2018-05-08: 20 ug/min via INTRAVENOUS

## 2018-05-08 MED ORDER — THROMBIN 20000 UNITS EX SOLR
CUTANEOUS | Status: DC | PRN
  Administered 2018-05-08: 20 mL via TOPICAL

## 2018-05-08 MED ORDER — SENNA 8.6 MG PO TABS
1.0000 | ORAL_TABLET | Freq: Two times a day (BID) | ORAL | Status: DC
  Administered 2018-05-08 – 2018-05-09 (×2): 8.6 mg via ORAL
  Filled 2018-05-08 (×2): qty 1

## 2018-05-08 MED ORDER — SODIUM CHLORIDE 0.9% FLUSH: 3.0000 mL | INTRAVENOUS | Status: DC | PRN

## 2018-05-08 MED ORDER — OXYCODONE-ACETAMINOPHEN 5-325 MG PO TABS
1.0000 | ORAL_TABLET | ORAL | Status: DC | PRN
  Administered 2018-05-08 – 2018-05-09 (×5): 1 via ORAL
  Filled 2018-05-08 (×5): qty 1

## 2018-05-08 MED ORDER — ACETAMINOPHEN 650 MG RE SUPP: 650.0000 mg | RECTAL | Status: DC | PRN

## 2018-05-08 MED ORDER — MIDAZOLAM HCL 2 MG/2ML IJ SOLN
INTRAMUSCULAR | Status: AC
  Filled 2018-05-08: qty 2

## 2018-05-08 MED ORDER — MENTHOL 3 MG MT LOZG: 1.0000 | LOZENGE | OROMUCOSAL | Status: DC | PRN

## 2018-05-08 MED ORDER — METHOCARBAMOL 750 MG PO TABS: 750.0000 mg | ORAL_TABLET | Freq: Four times a day (QID) | ORAL | Status: DC

## 2018-05-08 MED ORDER — POTASSIUM CHLORIDE IN NACL 20-0.9 MEQ/L-% IV SOLN
INTRAVENOUS | Status: DC
  Administered 2018-05-08: 15:00:00 via INTRAVENOUS

## 2018-05-08 MED ORDER — SODIUM CHLORIDE 0.9% FLUSH: 3.0000 mL | Freq: Two times a day (BID) | INTRAVENOUS | Status: DC

## 2018-05-08 MED ORDER — BUPIVACAINE HCL (PF) 0.25 % IJ SOLN
INTRAMUSCULAR | Status: DC | PRN
  Administered 2018-05-08: 7 mL

## 2018-05-08 MED ORDER — DEXAMETHASONE SODIUM PHOSPHATE 10 MG/ML IJ SOLN
10.0000 mg | INTRAMUSCULAR | Status: AC
  Administered 2018-05-08: 10 mg via INTRAVENOUS

## 2018-05-08 MED ORDER — FENTANYL CITRATE (PF) 250 MCG/5ML IJ SOLN
INTRAMUSCULAR | Status: DC | PRN
  Administered 2018-05-08 (×2): 100 ug via INTRAVENOUS
  Administered 2018-05-08 (×4): 50 ug via INTRAVENOUS
  Administered 2018-05-08: 100 ug via INTRAVENOUS

## 2018-05-08 MED ORDER — ATORVASTATIN CALCIUM 20 MG PO TABS
20.0000 mg | ORAL_TABLET | Freq: Every day | ORAL | Status: DC
Start: 2018-05-08 — End: 2018-05-09
  Administered 2018-05-08: 20 mg via ORAL
  Filled 2018-05-08: qty 1

## 2018-05-08 MED ORDER — ONDANSETRON HCL 4 MG/2ML IJ SOLN
INTRAMUSCULAR | Status: DC | PRN
  Administered 2018-05-08: 4 mg via INTRAVENOUS

## 2018-05-08 MED ORDER — THROMBIN 20000 UNITS EX SOLR
CUTANEOUS | Status: AC
  Filled 2018-05-08: qty 20000

## 2018-05-08 MED ORDER — METOCLOPRAMIDE HCL 5 MG/ML IJ SOLN: 10.0000 mg | Freq: Once | INTRAMUSCULAR | Status: DC | PRN

## 2018-05-08 MED ORDER — LACTATED RINGERS IV SOLN
INTRAVENOUS | Status: DC | PRN
  Administered 2018-05-08: 08:00:00 via INTRAVENOUS

## 2018-05-08 MED ORDER — PROPOFOL 10 MG/ML IV BOLUS
INTRAVENOUS | Status: DC | PRN
  Administered 2018-05-08: 160 mg via INTRAVENOUS

## 2018-05-08 MED ORDER — LIDOCAINE 2% (20 MG/ML) 5 ML SYRINGE
INTRAMUSCULAR | Status: DC | PRN
  Administered 2018-05-08: 100 mg via INTRAVENOUS

## 2018-05-08 MED ORDER — BUPIVACAINE HCL (PF) 0.25 % IJ SOLN
INTRAMUSCULAR | Status: AC
  Filled 2018-05-08: qty 30

## 2018-05-08 MED ORDER — PHENOL 1.4 % MT LIQD: 1.0000 | OROMUCOSAL | Status: DC | PRN

## 2018-05-08 MED ORDER — CEFAZOLIN SODIUM-DEXTROSE 2-4 GM/100ML-% IV SOLN
2.0000 g | Freq: Three times a day (TID) | INTRAVENOUS | Status: AC
  Administered 2018-05-08 – 2018-05-09 (×2): 2 g via INTRAVENOUS
  Filled 2018-05-08 (×2): qty 100

## 2018-05-08 MED ORDER — ROCURONIUM BROMIDE 10 MG/ML (PF) SYRINGE
PREFILLED_SYRINGE | INTRAVENOUS | Status: AC
  Filled 2018-05-08: qty 10

## 2018-05-08 MED ORDER — LABETALOL HCL 5 MG/ML IV SOLN
5.0000 mg | Freq: Once | INTRAVENOUS | Status: AC
  Administered 2018-05-08: 5 mg via INTRAVENOUS

## 2018-05-08 MED ORDER — HEMOSTATIC AGENTS (NO CHARGE) OPTIME
TOPICAL | Status: DC | PRN
  Administered 2018-05-08 (×2): 1 via TOPICAL

## 2018-05-08 MED ORDER — CHLORHEXIDINE GLUCONATE CLOTH 2 % EX PADS: 6.0000 | MEDICATED_PAD | Freq: Once | CUTANEOUS | Status: DC

## 2018-05-08 MED ORDER — METHOCARBAMOL 750 MG PO TABS
750.0000 mg | ORAL_TABLET | Freq: Four times a day (QID) | ORAL | Status: DC
  Administered 2018-05-08 – 2018-05-09 (×4): 750 mg via ORAL
  Filled 2018-05-08 (×4): qty 1

## 2018-05-08 MED ORDER — MIDAZOLAM HCL 2 MG/2ML IJ SOLN
INTRAMUSCULAR | Status: DC | PRN
  Administered 2018-05-08: 2 mg via INTRAVENOUS

## 2018-05-08 MED ORDER — ONDANSETRON HCL 4 MG/2ML IJ SOLN: 4.0000 mg | Freq: Four times a day (QID) | INTRAMUSCULAR | Status: DC | PRN

## 2018-05-08 MED ORDER — DEXAMETHASONE SODIUM PHOSPHATE 10 MG/ML IJ SOLN
INTRAMUSCULAR | Status: AC
  Filled 2018-05-08: qty 1

## 2018-05-08 MED ORDER — ONDANSETRON HCL 4 MG PO TABS: 4.0000 mg | ORAL_TABLET | Freq: Four times a day (QID) | ORAL | Status: DC | PRN

## 2018-05-08 MED ORDER — CEFAZOLIN SODIUM-DEXTROSE 2-4 GM/100ML-% IV SOLN
INTRAVENOUS | Status: AC
  Filled 2018-05-08: qty 100

## 2018-05-08 MED ORDER — FENTANYL CITRATE (PF) 100 MCG/2ML IJ SOLN: 25.0000 ug | INTRAMUSCULAR | Status: DC | PRN

## 2018-05-08 MED ORDER — 0.9 % SODIUM CHLORIDE (POUR BTL) OPTIME
TOPICAL | Status: DC | PRN
  Administered 2018-05-08: 1000 mL

## 2018-05-08 MED ORDER — LABETALOL HCL 5 MG/ML IV SOLN
INTRAVENOUS | Status: AC
  Filled 2018-05-08: qty 4

## 2018-05-08 MED ORDER — DEXAMETHASONE SODIUM PHOSPHATE 4 MG/ML IJ SOLN
4.0000 mg | Freq: Four times a day (QID) | INTRAMUSCULAR | Status: DC
  Administered 2018-05-08: 4 mg via INTRAVENOUS
  Filled 2018-05-08: qty 1

## 2018-05-08 SURGICAL SUPPLY — 62 items
APL SKNCLS STERI-STRIP NONHPOA (GAUZE/BANDAGES/DRESSINGS) ×1
BAG DECANTER FOR FLEXI CONT (MISCELLANEOUS) ×3 IMPLANT
BASKET BONE COLLECTION (BASKET) ×2 IMPLANT
BENZOIN TINCTURE PRP APPL 2/3 (GAUZE/BANDAGES/DRESSINGS) ×3 IMPLANT
BIT DRILL 13 (BIT) ×2 IMPLANT
BIT DRILL 13MM (BIT) ×1
BUR MATCHSTICK NEURO 3.0 LAGG (BURR) ×3 IMPLANT
CAGE PEEK 7X16X14 (Cage) ×8 IMPLANT
CAGE PEEK 7X16X14MM (Cage) ×4 IMPLANT
CANISTER SUCT 3000ML PPV (MISCELLANEOUS) ×3 IMPLANT
CARTRIDGE OIL MAESTRO DRILL (MISCELLANEOUS) ×1 IMPLANT
CLOSURE WOUND 1/2 X4 (GAUZE/BANDAGES/DRESSINGS) ×1
DIFFUSER DRILL AIR PNEUMATIC (MISCELLANEOUS) ×3 IMPLANT
DRAIN SNY WOU 7FLT (WOUND CARE) ×2 IMPLANT
DRAPE C-ARM 42X72 X-RAY (DRAPES) ×6 IMPLANT
DRAPE LAPAROTOMY 100X72 PEDS (DRAPES) ×3 IMPLANT
DRAPE MICROSCOPE LEICA (MISCELLANEOUS) ×3 IMPLANT
DRSG OPSITE POSTOP 4X6 (GAUZE/BANDAGES/DRESSINGS) ×3 IMPLANT
DURAPREP 6ML APPLICATOR 50/CS (WOUND CARE) ×3 IMPLANT
ELECT COATED BLADE 2.86 ST (ELECTRODE) ×3 IMPLANT
ELECT REM PT RETURN 9FT ADLT (ELECTROSURGICAL) ×3
ELECTRODE REM PT RTRN 9FT ADLT (ELECTROSURGICAL) ×1 IMPLANT
EVACUATOR SILICONE 100CC (DRAIN) ×2 IMPLANT
FLOSEAL 5ML (HEMOSTASIS) ×4 IMPLANT
GAUZE SPONGE 4X4 16PLY XRAY LF (GAUZE/BANDAGES/DRESSINGS) IMPLANT
GLOVE BIO SURGEON STRL SZ7 (GLOVE) ×2 IMPLANT
GLOVE BIO SURGEON STRL SZ8 (GLOVE) ×3 IMPLANT
GLOVE BIOGEL PI IND STRL 7.0 (GLOVE) IMPLANT
GLOVE BIOGEL PI IND STRL 8 (GLOVE) IMPLANT
GLOVE BIOGEL PI INDICATOR 7.0 (GLOVE) ×4
GLOVE BIOGEL PI INDICATOR 8 (GLOVE) ×4
GLOVE ECLIPSE 7.5 STRL STRAW (GLOVE) ×6 IMPLANT
GOWN STRL REUS W/ TWL LRG LVL3 (GOWN DISPOSABLE) ×1 IMPLANT
GOWN STRL REUS W/ TWL XL LVL3 (GOWN DISPOSABLE) ×1 IMPLANT
GOWN STRL REUS W/TWL 2XL LVL3 (GOWN DISPOSABLE) ×2 IMPLANT
GOWN STRL REUS W/TWL LRG LVL3 (GOWN DISPOSABLE) ×3
GOWN STRL REUS W/TWL XL LVL3 (GOWN DISPOSABLE) ×3
HEMOSTAT POWDER KIT SURGIFOAM (HEMOSTASIS) ×3 IMPLANT
KIT BASIN OR (CUSTOM PROCEDURE TRAY) ×3 IMPLANT
KIT TURNOVER KIT B (KITS) ×3 IMPLANT
NDL HYPO 25X1 1.5 SAFETY (NEEDLE) ×1 IMPLANT
NDL SPNL 20GX3.5 QUINCKE YW (NEEDLE) ×1 IMPLANT
NEEDLE HYPO 25X1 1.5 SAFETY (NEEDLE) ×3 IMPLANT
NEEDLE SPNL 20GX3.5 QUINCKE YW (NEEDLE) ×3 IMPLANT
NS IRRIG 1000ML POUR BTL (IV SOLUTION) ×3 IMPLANT
OIL CARTRIDGE MAESTRO DRILL (MISCELLANEOUS) ×3
PACK LAMINECTOMY NEURO (CUSTOM PROCEDURE TRAY) ×3 IMPLANT
PAD ARMBOARD 7.5X6 YLW CONV (MISCELLANEOUS) ×12 IMPLANT
PIN DISTRACTION 14MM (PIN) ×6 IMPLANT
PLATE 4 80XLCK NS SPNE CVD (Plate) IMPLANT
PLATE 4 ATLANTIS TRANS (Plate) ×3 IMPLANT
RUBBERBAND STERILE (MISCELLANEOUS) ×6 IMPLANT
SCREW SPINAL 4X16MM SELF DRILL (Screw) ×30 IMPLANT
SPONGE INTESTINAL PEANUT (DISPOSABLE) ×3 IMPLANT
SPONGE SURGIFOAM ABS GEL 100 (HEMOSTASIS) ×3 IMPLANT
STRIP CLOSURE SKIN 1/2X4 (GAUZE/BANDAGES/DRESSINGS) ×2 IMPLANT
SUT VIC AB 3-0 SH 8-18 (SUTURE) ×7 IMPLANT
SUT VICRYL 4-0 PS2 18IN ABS (SUTURE) ×2 IMPLANT
TOWEL GREEN STERILE (TOWEL DISPOSABLE) ×3 IMPLANT
TOWEL GREEN STERILE FF (TOWEL DISPOSABLE) ×3 IMPLANT
TRAY FOLEY MTR SLVR 16FR STAT (SET/KITS/TRAYS/PACK) ×2 IMPLANT
WATER STERILE IRR 1000ML POUR (IV SOLUTION) ×3 IMPLANT

## 2018-05-08 NOTE — Evaluation (Signed)
Occupational Therapy Evaluation Patient Details Name: Leonard Herrera MRN: [id number] DOB: 15-Dec-1965 Today's Date: 05/08/2018    History of Present Illness 52 yo male admitted for ACDF. PMH including anxiety, depression, HTN, lumbar spinal stenosis with lumbar decompression (2017), MI, and neuropathy.    Clinical Impression   PTA, pt was living alone and was independent; plans to stay at his parent's home at dc. Currently, pt requires Min A for UB ADLs, Min Guard A for LB ADLs, and Min Guard A for functional mobility. Provided education and handout on cervical precautions, bed mobility, LB ADLs, and toilet transfer; pt demonstrated understanding.Pt would benefit from further acute OT to address UB ADLs and adherence to precautions during ADLs. Recommend dc home once medically stable per physician.     Follow Up Recommendations  No OT follow up;Supervision/Assistance - 24 hour    Equipment Recommendations  None recommended by OT    Recommendations for Other Services PT consult     Precautions / Restrictions Precautions Precautions: Cervical Precaution Booklet Issued: Yes (comment) Precaution Comments: Provided handout and reviewed all cervical precautions Required Braces or Orthoses: Other Brace/Splint Other Brace/Splint: "No brace needed" per MD order. Soft collar in room for pt to use as needed. Restrictions Weight Bearing Restrictions: No      Mobility Bed Mobility Overal bed mobility: Needs Assistance Bed Mobility: Rolling;Sidelying to Sit Rolling: Min guard Sidelying to sit: Min guard       General bed mobility comments: Education pt on log roll technique. MIn GUard A for safety with bed positioned to simulated home set up.   Transfers Overall transfer level: Needs assistance Equipment used: None Transfers: Sit to/from Stand Sit to Stand: Min guard         General transfer comment: MIn Guard A for safety    Balance Overall balance assessment: Mild deficits  observed, not formally tested                                         ADL either performed or assessed with clinical judgement   ADL Overall ADL's : Needs assistance/impaired Eating/Feeding: Set up;Sitting   Grooming: Min guard;Standing Grooming Details (indicate cue type and reason): Education pt on compensatory techniuqes for oral care at sink and adherance to cervical precautions Upper Body Bathing: Minimal assistance;Sitting   Lower Body Bathing: Min guard;Sit to/from stand   Upper Body Dressing : Minimal assistance;Sitting Upper Body Dressing Details (indicate cue type and reason): Needs education on compensatory techniques for adherance to cervical precautions Lower Body Dressing: Min guard;Sit to/from stand Lower Body Dressing Details (indicate cue type and reason): Min Guard A for safety in standing. Pt abel to bring ankles to knees for donning socks.  Toilet Transfer: Min guard;Ambulation;Regular Toilet           Functional mobility during ADLs: Min guard General ADL Comments: Pt requiring Min A for UB ADLs and Min Guard A for LB ADLs. Pt will need further education on UB ADLs.      Vision         Perception     Praxis      Pertinent Vitals/Pain Pain Assessment: Faces Faces Pain Scale: Hurts little more Pain Location: Neck Pain Descriptors / Indicators: Discomfort;Grimacing Pain Intervention(s): Monitored during session;Repositioned;Premedicated before session     Hand Dominance Right   Extremity/Trunk Assessment Upper Extremity Assessment Upper Extremity Assessment: LUE deficits/detail LUE  Deficits / Details: Decreased grasp strength during testing; RN notified. using WFL for funcitonal tasks   Lower Extremity Assessment Lower Extremity Assessment: Defer to PT evaluation   Cervical / Trunk Assessment Cervical / Trunk Assessment: Other exceptions Cervical / Trunk Exceptions: s/p cervical fusion   Communication  Communication Communication: No difficulties   Cognition Arousal/Alertness: Awake/alert Behavior During Therapy: WFL for tasks assessed/performed Overall Cognitive Status: Within Functional Limits for tasks assessed                                 General Comments: Pt with decreased awarness suspected from pain medication   General Comments  Mother present throughout session    Exercises     Shoulder Instructions      Home Living Family/patient expects to be discharged to:: Private residence Living Arrangements: Parent Available Help at Discharge: Family;Available 24 hours/day Type of Home: House Home Access: Stairs to enter Entergy CorporationEntrance Stairs-Number of Steps: two Entrance Stairs-Rails: Right Home Layout: One level     Bathroom Shower/Tub: Producer, television/film/videoWalk-in shower   Bathroom Toilet: Standard     Home Equipment: Shower seat   Additional Comments: Above information is parent's home which pt will go at dc. Pt's home is a Health visitortwo-story town home with 18 stairs.       Prior Functioning/Environment Level of Independence: Independent                 OT Problem List: Decreased strength;Decreased range of motion;Decreased activity tolerance;Impaired balance (sitting and/or standing);Decreased safety awareness;Decreased knowledge of use of DME or AE;Decreased knowledge of precautions;Pain      OT Treatment/Interventions: Self-care/ADL training;Therapeutic exercise;Energy conservation;DME and/or AE instruction;Therapeutic activities;Patient/family education    OT Goals(Current goals can be found in the care plan section) Acute Rehab OT Goals Patient Stated Goal: Go home OT Goal Formulation: With patient Time For Goal Achievement: 05/22/18 Potential to Achieve Goals: Good ADL Goals Pt Will Perform Upper Body Dressing: with modified independence;sitting Additional ADL Goal #1: Pt will perform log roll technique for bed mobility at Mod I level in preparation for  ADLs Additional ADL Goal #2: Pt will adhere to cervical precautions during ADLs with 1-2 VCs  OT Frequency: Min 2X/week   Barriers to D/C:            Co-evaluation              AM-PAC PT "6 Clicks" Daily Activity     Outcome Measure Help from another person eating meals?: None Help from another person taking care of personal grooming?: A Little Help from another person toileting, which includes using toliet, bedpan, or urinal?: A Little Help from another person bathing (including washing, rinsing, drying)?: A Little Help from another person to put on and taking off regular upper body clothing?: A Little Help from another person to put on and taking off regular lower body clothing?: A Little 6 Click Score: 19   End of Session Equipment Utilized During Treatment: Cervical collar Nurse Communication: Mobility status;Precautions  Activity Tolerance: Patient tolerated treatment well Patient left: in chair;with call bell/phone within reach;with family/visitor present  OT Visit Diagnosis: Unsteadiness on feet (R26.81);Other abnormalities of gait and mobility (R26.89);Muscle weakness (generalized) (M62.81);Pain Pain - part of body: (Neck)                Time: [id number] OT Time Calculation (min): 24 min Charges:  OT General Charges $OT Visit: 1 Visit OT Evaluation $OT  Eval Low Complexity: 1 Low G-Codes:     Ada Woodbury MSOT, OTR/L Acute Rehab Pager: [phone] Office: [phone]  Theodoro Grist Kaleeya Hancock 05/08/2018, 5:21 PM

## 2018-05-08 NOTE — Anesthesia Procedure Notes (Signed)
Procedure Name: Intubation Date/Time: 05/08/2018 8:36 AM Performed by: White, Amedeo Plenty, CRNA Pre-anesthesia Checklist: Patient identified, Emergency Drugs available, Suction available and Patient being monitored Patient Re-evaluated:Patient Re-evaluated prior to induction Oxygen Delivery Method: Circle System Utilized Preoxygenation: Pre-oxygenation with 100% oxygen Induction Type: IV induction Ventilation: Mask ventilation without difficulty and Oral airway inserted - appropriate to patient size Laryngoscope Size: Mac and 4 Grade View: Grade II Tube type: Oral Tube size: 7.5 mm Number of attempts: 1 Airway Equipment and Method: Stylet and Oral airway Placement Confirmation: ETT inserted through vocal cords under direct vision,  positive ETCO2 and breath sounds checked- equal and bilateral Secured at: 23 cm Tube secured with: Tape Dental Injury: Teeth and Oropharynx as per pre-operative assessment

## 2018-05-08 NOTE — Op Note (Signed)
05/08/2018  12:24 PM  PATIENT:  Leonard Herrera  52 y.o. male  PRE-OPERATIVE DIAGNOSIS:  Cervical spinal stenosis C3-4 C4-5 C5-6 and C6-7 with neck and bilateral arm pain  POST-OPERATIVE DIAGNOSIS:  same  PROCEDURE:  1. Decompressive anterior cervical discectomy C3-4 C4-5 C5-6 C6-7, 2. Anterior cervical arthrodesis C3-4 C4-5 and C5-C6 C6-7 utilizing a peek interbody cage packed with locally harvested morcellized autologous bone graft, 3. Anterior cervical plating C3-4 C4-5 C5-6 C6-7 utilizing a Atlantis translational plate  SURGEON:  Marikay Alaravid Katia Hannen, MD  ASSISTANTS: Verlin DikeKimberly Meyran FNP  ANESTHESIA:   General  EBL: 200 ml  Total I/O In: 1500 [I.V.:1500] Out: 700 [Urine:500; Blood:200]  BLOOD ADMINISTERED: none  DRAINS: none  SPECIMEN:  none  INDICATION FOR PROCEDURE: This patient presented with a long history of neck and bilateral arm pain. Imaging showed spinal stenosis C3-4 to C6-7 with neural foraminal stenosis. The patient tried conservative measures without relief. Pain was debilitating. Recommended ACDF with plating. Patient understood the risks, benefits, and alternatives and potential outcomes and wished to proceed.  PROCEDURE DETAILS: Patient was brought to the operating room placed under general endotracheal anesthesia. Patient was placed in the supine position on the operating room table. The neck was prepped with Duraprep and draped in a sterile fashion.   Three cc of local anesthesia was injected and a transverse incision was made on the right side of the neck.  Dissection was carried down thru the subcutaneous tissue and the platysma was  elevated, opened, and undermined with Metzenbaum scissors.  Dissection was then carried out thru an avascular plane leaving the sternocleidomastoid carotid artery and jugular vein laterally and the trachea and esophagus medially. The ventral aspect of the vertebral column was identified and a localizing x-ray was taken. The C3-4 level was  identified. The longus colli muscles were then elevated and the retractor was placed to expose C3-4 C4-5 C5-6 and C6-7. The annulus was incised and the disc space entered at each level. Discectomy was performed at each level exactly the same, and was performed with micro-curettes and pituitary rongeurs. I then used the high-speed drill to drill the endplates down to the level of the posterior longitudinal ligament. The drill shavings were saved in a mucous trap for later arthrodesis. The operating microscope was draped and brought into the field provided additional magnification, illumination and visualization. Discectomy was continued posteriorly thru the disc space. Posterior longitudinal ligament was opened with a nerve hook, and then removed along with disc herniation and osteophytes, decompressing the spinal canal and thecal sac. We then continued to remove osteophytic overgrowth and disc material decompressing the neural foramina and exiting nerve roots bilaterally. The scope was angled up and down to help decompress and undercut the vertebral bodies. Once the decompression was completed we could pass a nerve hook circumferentially to assure adequate decompression in the midline and in the neural foramina. So by both visualization and palpation we felt we had an adequate decompression of the neural elements at each of the 4 levels. We then measured the height of the intravertebral disc space and selected a 7 millimeter Peek interbody cage packed with autograft. It was then gently positioned in the intravertebral disc spaces and countersunk at each level. I then used a Atlantis translational plate and placed 16 mm fixed angle screws into the vertebral bodies of each level C3-C7 inclusive and locked them into position. The wound was irrigated with bacitracin solution, checked for hemostasis which was established and confirmed. Once meticulous hemostasis  was achieved, we then placed a 7 flat JP drain and  proceeded with closure. The platysma was closed with interrupted 3-0 undyed Vicryl suture, the subcuticular layer was closed with interrupted 3-0 undyed Vicryl suture. The skin edges were approximated with steristrips. The drapes were removed. A sterile dressing was applied. The patient was then awakened from general anesthesia and transferred to the recovery room in stable condition. At the end of the procedure all sponge, needle and instrument counts were correct.   PLAN OF CARE: Admit for overnight observation  PATIENT DISPOSITION:  PACU - hemodynamically stable.   Delay start of Pharmacological VTE agent (>24hrs) due to surgical blood loss or risk of bleeding:  yes

## 2018-05-08 NOTE — Progress Notes (Signed)
05/08/18 1808  PT Visit Information  Last PT Received On 05/08/18  Assistance Needed +1  History of Present Illness 52 yo male admitted for ACDF. PMH including anxiety, depression, HTN, lumbar spinal stenosis with lumbar decompression (2017), MI, and neuropathy.   Precautions  Precautions Cervical  Precaution Booklet Issued Yes (comment)  Precaution Comments Required review of cervical precautions, as pt only remembering "do not twist"  Required Braces or Orthoses Other Brace/Splint  Other Brace/Splint "No brace needed" per MD order. Soft collar in room for pt to use as needed.  Restrictions  Weight Bearing Restrictions No  Home Living  Family/patient expects to be discharged to: Private residence  Living Arrangements Parent  Available Help at Discharge Family;Available 24 hours/day  Type of Home House  Home Access Stairs to enter  Entrance Stairs-Number of Steps 3  Entrance Stairs-Rails Right  Home Layout One level  Bathroom Engineer, manufacturing seat  Additional Comments Above information is parent's home which pt will go at dc. Pt's home is a Health visitor town home with 18 stairs.   Prior Function  Level of Independence Independent  Communication  Communication No difficulties  Pain Assessment  Pain Assessment Faces  Faces Pain Scale 4  Pain Location Neck  Pain Descriptors / Indicators Discomfort;Grimacing  Pain Intervention(s) Limited activity within patient's tolerance;Monitored during session;Repositioned  Cognition  Arousal/Alertness Awake/alert  Behavior During Therapy WFL for tasks assessed/performed  Overall Cognitive Status Within Functional Limits for tasks assessed  General Comments Pt with decreased awarness suspected from pain medication  Upper Extremity Assessment  Upper Extremity Assessment Defer to OT evaluation (numbness in thumb on R hand )  Lower Extremity Assessment  Lower Extremity Assessment  Generalized weakness (reports heavy feeling in BLE )  Cervical / Trunk Assessment  Cervical / Trunk Assessment Other exceptions  Cervical / Trunk Exceptions s/p cervical fusion  Bed Mobility  General bed mobility comments Pt up in chair upon entry  Transfers  Overall transfer level Needs assistance  Equipment used None  Transfers Sit to/from Stand  Sit to Stand Min guard  General transfer comment MIn Guard A for safety  Ambulation/Gait  Ambulation/Gait assistance Min guard  Gait Distance (Feet) 200 Feet  Assistive device IV Pole  Gait Pattern/deviations Step-through pattern;Decreased stride length;Trunk flexed  General Gait Details Slow, very guarded gait. Cues to relax shoulders and for upright posture throughout. Educated about use of RW to improve posture during gait and help with LE fatigue. Educated about generalized walking program to perform at home.   Gait velocity Decreased   Balance  Overall balance assessment Mild deficits observed, not formally tested  General Comments  General comments (skin integrity, edema, etc.) Mother present throughout session  PT - End of Session  Equipment Utilized During Treatment Gait belt;Cervical collar  Activity Tolerance Patient tolerated treatment well  Patient left in chair;with call bell/phone within reach;with family/visitor present  Nurse Communication Mobility status  PT Assessment  PT Recommendation/Assessment Patient needs continued PT services  PT Visit Diagnosis Other abnormalities of gait and mobility (R26.89);Pain;Muscle weakness (generalized) (M62.81)  Pain - part of body  (neck )  PT Problem List Decreased mobility;Decreased knowledge of use of DME;Decreased knowledge of precautions;Decreased balance;Pain;Decreased range of motion;Decreased strength  PT Plan  PT Frequency (ACUTE ONLY) Min 5X/week  PT Treatment/Interventions (ACUTE ONLY) DME instruction;Gait training;Stair training;Functional mobility training;Therapeutic  activities;Therapeutic exercise;Balance training;Patient/family education  AM-PAC PT "6 Clicks" Daily Activity Outcome Measure  Difficulty  turning over in bed (including adjusting bedclothes, sheets and blankets)? 3  Difficulty moving from lying on back to sitting on the side of the bed?  1  Difficulty sitting down on and standing up from a chair with arms (e.g., wheelchair, bedside commode, etc,.)? 1  Help needed moving to and from a bed to chair (including a wheelchair)? 3  Help needed walking in hospital room? 3  Help needed climbing 3-5 steps with a railing?  3  6 Click Score 14  Mobility G Code  CK  PT Recommendation  Follow Up Recommendations No PT follow up;Supervision for mobility/OOB  PT equipment Rolling walker with 5" wheels  Individuals Consulted  Consulted and Agree with Results and Recommendations Patient;Family member/caregiver  Family Member Consulted mother   Acute Rehab PT Goals  Patient Stated Goal Go home  PT Goal Formulation With patient  Time For Goal Achievement 05/22/18  Potential to Achieve Goals Good  PT Time Calculation  PT Start Time (ACUTE ONLY) 1704  PT Stop Time (ACUTE ONLY) 1724  PT Time Calculation (min) (ACUTE ONLY) 20 min  PT General Charges  $$ ACUTE PT VISIT 1 Visit  PT Evaluation  $PT Eval Low Complexity 1 Low  Written Expression  Dominant Hand Right   Patient is s/p above surgery resulting in the deficits listed below (see PT Problem List). Pt requiring gross min guard throughout mobility, however, very guarded throughout and reports BLE feeling "heavy." Reviewed use of DME, precautions, and walking program to perform at home. Pt reports he will be staying with his parents at d/c. Patient will benefit from skilled PT to increase their independence and safety with mobility (while adhering to their precautions) to allow discharge to the venue listed below.  Leonard Herrera, PT, DPT  Acute Rehabilitation Services  Pager: [phone]

## 2018-05-08 NOTE — Anesthesia Postprocedure Evaluation (Signed)
Anesthesia Post Note  Patient: Leonard Herrera  Procedure(s) Performed: ANTERIOR CERVICAL DECOMPRESSION FUSION CERVICAL THREE-FOUR,CERVICAL FOUR-FIVE,CERVICAL FIVE-SIX,CERVICAL SIX-SEVEN. (N/A Spine Cervical)     Patient location during evaluation: PACU Anesthesia Type: General Level of consciousness: awake and alert Pain management: pain level controlled Vital Signs Assessment: post-procedure vital signs reviewed and stable Respiratory status: spontaneous breathing, nonlabored ventilation, respiratory function stable and patient connected to nasal cannula oxygen Cardiovascular status: blood pressure returned to baseline and stable Postop Assessment: no apparent nausea or vomiting Anesthetic complications: no    Last Vitals:  Vitals:   05/08/18 1228 05/08/18 1245  BP: 134/76 125/79  Pulse: 83 79  Resp: 19 17  Temp: (!) 36.2 C   SpO2: 93% 97%    Last Pain:  Vitals:   05/08/18 1228  TempSrc:   PainSc: Asleep                 Phillips Groutarignan, Marilea Gwynne

## 2018-05-08 NOTE — H&P (Signed)
Subjective:   Patient is a 52 y.o. male admitted for ACDF. The patient first presented to me with complaints of neck pain, shooting pains in the arm(s), numbness of the arm(s) and loss of strength of the arm(s). Onset of symptoms was several years ago. The pain is described as aching and occurs all day. The pain is rated severe, and is located in the neck and radiates to the RUE > LUE. The symptoms have been progressive. Symptoms are exacerbated by extending head backwards, and are relieved by none.  Previous work up includes MRI of cervical spine, results: spinal stenosis.  Past Medical History:  Diagnosis Date  . Acid indigestion   . Anxiety   . Conductive hearing loss   . Coronary artery disease   . Depression   . Dyslipidemia   . Edema extremities    WITH  CERTAIN FOOD  . History of bronchitis   . Hypertension   . Impingement syndrome of shoulder    right shoulder  . Lumbar spinal stenosis   . Myocardial infarction [facility]) 2004   x2  . Neuropathy   . Nocturia   . Sleep apnea    diagnosed 02/10/2016 - does not have CPAP at this time    Past Surgical History:  Procedure Laterality Date  . CARDIAC CATHETERIZATION     with stent placement  . COLONOSCOPY    . CORONARY ANGIOPLASTY    . HERNIA REPAIR     "as an infant"  . LUMBAR LAMINECTOMY/DECOMPRESSION MICRODISCECTOMY Bilateral 02/17/2016   Procedure: Laminectomy and Foraminotomy - L3-L4 - L4-L5 - L5-S1 - bilateral;  Surgeon: Tia Alert, MD;  Location: [facility];  Service: Neurosurgery;  Laterality: Bilateral;  Laminectomy and Foraminotomy - L3-L4 - L4-L5 - L5-S1 - bilateral    No Known Allergies  Social History   Tobacco Use  . Smoking status: Former Games developer  . Smokeless tobacco: Never Used  . Tobacco comment: qiut 1999  Substance Use Topics  . Alcohol use: Yes    Alcohol/week: 1.2 oz    Types: 2 Cans of beer per week    Comment: socially    History reviewed. No pertinent family history. Prior to Admission  medications   Medication Sig Start Date End Date Taking? Authorizing Provider  aspirin EC 81 MG tablet Take 81 mg by mouth daily.   Yes [provider]  atorvastatin (LIPITOR) 20 MG tablet Take 20 mg by mouth daily.   Yes [provider]  cetirizine (ZYRTEC) 5 MG tablet Take 5 mg by mouth daily.   Yes [provider]  clopidogrel (PLAVIX) 75 MG tablet Take 75 mg by mouth daily.   Yes [provider]  fluticasone (FLONASE) 50 MCG/ACT nasal spray Place 1 spray into both nostrils 2 (two) times daily.    Yes [provider]  ipratropium (ATROVENT) 0.06 % nasal spray Place 1 spray into both nostrils 2 (two) times daily as needed for rhinitis.    Yes [provider]  labetalol (NORMODYNE) 300 MG tablet Take 300 mg by mouth 2 (two) times daily.    Yes [provider]  lisinopril (PRINIVIL,ZESTRIL) 40 MG tablet Take 40 mg by mouth daily.    Yes [provider]  magnesium oxide (MAG-OX) 400 MG tablet Take 400 mg by mouth 2 (two) times daily.   Yes [provider]  methocarbamol (ROBAXIN) 750 MG tablet Take 750 mg by mouth 4 (four) times daily.   Yes [provider]  Multiple  Vitamin (MULTIVITAMIN) tablet Take 1 tablet by mouth daily.   Yes [provider]  oxyCODONE-acetaminophen (PERCOCET) 10-325 MG tablet Take 1 tablet by mouth every 4 (four) hours as needed for pain. Patient taking differently: Take 1 tablet by mouth 2 (two) times daily.  02/18/16  Yes Tia Alert, MD  pregabalin (LYRICA) 300 MG capsule Take 300 mg by mouth 2 (two) times daily.   Yes [provider]  Tetrahydrozoline HCl (VISINE OP) Place 1 drop into both eyes daily as needed (for allergies).   Yes [provider]  triamcinolone lotion (KENALOG) 0.1 % Apply 1 application topically 3 (three) times daily.   Yes [provider]     Review of Systems  Positive ROS: neg  All other systems have been reviewed and  were otherwise negative with the exception of those mentioned in the HPI and as above.  Objective: Vital signs in last 24 hours: Temp:  [98.3 F (36.8 C)] 98.3 F (36.8 C) (07/10 0720) Pulse Rate:  [85] 85 (07/10 0720) Resp:  [20] 20 (07/10 0720) BP: (158)/(98) 158/98 (07/10 0720) SpO2:  [100 %] 100 % (07/10 0720)  General Appearance: Alert, cooperative, no distress, appears stated age Head: Normocephalic, without obvious abnormality, atraumatic Eyes: PERRL, conjunctiva/corneas clear, EOM's intact      Neck: Supple, symmetrical, trachea midline, Back: Symmetric, no curvature, ROM normal, no CVA tenderness Lungs:  respirations unlabored Heart: Regular rate and rhythm Abdomen: Soft, non-tender Extremities: Extremities normal, atraumatic, no cyanosis or edema Pulses: 2+ and symmetric all extremities Skin: Skin color, texture, turgor normal, no rashes or lesions  NEUROLOGIC:  Mental status: Alert and oriented x4, no aphasia, good attention span, fund of knowledge and memory  Motor Exam - grossly normal Sensory Exam - grossly normal Reflexes: 1+ Coordination - grossly normal Gait - grossly normal Balance - grossly normal Cranial Nerves: I: smell Not tested  II: visual acuity  OS: nl    OD: nl  II: visual fields Full to confrontation  II: pupils Equal, round, reactive to light  III,VII: ptosis None  III,IV,VI: extraocular muscles  Full ROM  V: mastication Normal  V: facial light touch sensation  Normal  V,VII: corneal reflex  Present  VII: facial muscle function - upper  Normal  VII: facial muscle function - lower Normal  VIII: hearing Not tested  IX: soft palate elevation  Normal  IX,X: gag reflex Present  XI: trapezius strength  5/5  XI: sternocleidomastoid strength 5/5  XI: neck flexion strength  5/5  XII: tongue strength  Normal    Data Review Lab Results  Component Value Date   WBC 4.8 04/30/2018   HGB 15.0 04/30/2018   HCT 48.9 04/30/2018   MCV 85.9  04/30/2018   PLT 183 04/30/2018   Lab Results  Component Value Date   NA 143 04/30/2018   K 4.4 04/30/2018   CL 109 04/30/2018   CO2 26 04/30/2018   BUN 10 04/30/2018   CREATININE 1.23 04/30/2018   GLUCOSE 89 04/30/2018   Lab Results  Component Value Date   INR 1.00 04/30/2018    Assessment:   Cervical neck pain with herniated nucleus pulposus/ spondylosis/ stenosis at C3-7. Estimated body mass index is 36.05 kg/m as calculated from the following:   Height as of 04/30/18: 5\' 9"  (1.753 m).   Weight as of 04/30/18: 110.7 kg (244 lb 1.6 oz).  Patient has failed conservative therapy. Planned surgery : ACDF C3-4 C4-5 C5-6 C6-7  Plan:  I explained the condition and procedure to the patient and answered any questions.  Patient wishes to proceed with procedure as planned. Understands risks/ benefits/ and expected or typical outcomes.  Nakshatra Klose S 05/08/2018 7:31 AM

## 2018-05-08 NOTE — Transfer of Care (Signed)
Immediate Anesthesia Transfer of Care Note  Patient: Leonard Herrera  Procedure(s) Performed: ANTERIOR CERVICAL DECOMPRESSION FUSION CERVICAL THREE-FOUR,CERVICAL FOUR-FIVE,CERVICAL FIVE-SIX,CERVICAL SIX-SEVEN. (N/A Spine Cervical)  Patient Location: PACU  Anesthesia Type:General  Level of Consciousness: drowsy and patient cooperative  Airway & Oxygen Therapy: Patient Spontanous Breathing  Post-op Assessment: Report given to RN and Post -op Vital signs reviewed and stable  Post vital signs: Reviewed and stable  Last Vitals:  Vitals Value Taken Time  BP 134/76 05/08/2018 12:28 PM  Temp    Pulse 78 05/08/2018 12:30 PM  Resp 15 05/08/2018 12:30 PM  SpO2 89 % 05/08/2018 12:30 PM  Vitals shown include unvalidated device data.  Last Pain:  Vitals:   05/08/18 0720  TempSrc: Oral  PainSc: 2          Complications: No apparent anesthesia complications

## 2018-05-08 NOTE — Anesthesia Preprocedure Evaluation (Addendum)
Anesthesia Evaluation  Patient identified by MRN, date of birth, ID band Patient awake    Reviewed: Allergy & Precautions, NPO status , Patient's Chart, lab work & pertinent test results  Airway Mallampati: II  TM Distance: >3 FB Neck ROM: Full    Dental no notable dental hx.    Pulmonary neg pulmonary ROS, former smoker,    Pulmonary exam normal breath sounds clear to auscultation       Cardiovascular hypertension, Pt. on medications + CAD, + Past MI and + Cardiac Stents  Normal cardiovascular exam Rhythm:Regular Rate:Normal  On 09/10/2017 he had an abnormal stress test. He followed up with cardiology 05/07/2018 and was cleared by Dr. Therisa DoyneFrederic Kahl (in care everywhere) stating "Patient is stable from CV status and has stopped Plavix in anticipation of his neck surgery, He is cleared for upcoming operation tomorrow."   Neuro/Psych negative neurological ROS  negative psych ROS   GI/Hepatic negative GI ROS, Neg liver ROS,   Endo/Other  negative endocrine ROS  Renal/GU negative Renal ROS  negative genitourinary   Musculoskeletal negative musculoskeletal ROS (+)   Abdominal   Peds negative pediatric ROS (+)  Hematology negative hematology ROS (+)   Anesthesia Other Findings   Reproductive/Obstetrics negative OB ROS                             Anesthesia Physical Anesthesia Plan  ASA: III  Anesthesia Plan: General   Post-op Pain Management:    Induction: Intravenous  PONV Risk Score and Plan: 2 and Ondansetron and Treatment may vary due to age or medical condition  Airway Management Planned: Oral ETT  Additional Equipment:   Intra-op Plan:   Post-operative Plan: Extubation in OR  Informed Consent: I have reviewed the patients History and Physical, chart, labs and discussed the procedure including the risks, benefits and alternatives for the proposed anesthesia with the patient or  authorized representative who has indicated his/her understanding and acceptance.   Dental advisory given  Plan Discussed with: CRNA  Anesthesia Plan Comments:        Anesthesia Quick Evaluation

## 2018-05-09 ENCOUNTER — Encounter (HOSPITAL_COMMUNITY): Payer: Self-pay | Admitting: Neurological Surgery

## 2018-05-09 DIAGNOSIS — M4802 Spinal stenosis, cervical region: Secondary | ICD-10-CM | POA: Diagnosis present

## 2018-05-09 DIAGNOSIS — Z7902 Long term (current) use of antithrombotics/antiplatelets: Secondary | ICD-10-CM | POA: Diagnosis not present

## 2018-05-09 DIAGNOSIS — I10 Essential (primary) hypertension: Secondary | ICD-10-CM | POA: Diagnosis present

## 2018-05-09 DIAGNOSIS — Z7951 Long term (current) use of inhaled steroids: Secondary | ICD-10-CM | POA: Diagnosis not present

## 2018-05-09 DIAGNOSIS — I252 Old myocardial infarction: Secondary | ICD-10-CM | POA: Diagnosis not present

## 2018-05-09 DIAGNOSIS — Z7982 Long term (current) use of aspirin: Secondary | ICD-10-CM | POA: Diagnosis not present

## 2018-05-09 DIAGNOSIS — G4730 Sleep apnea, unspecified: Secondary | ICD-10-CM | POA: Diagnosis present

## 2018-05-09 DIAGNOSIS — Z9861 Coronary angioplasty status: Secondary | ICD-10-CM | POA: Diagnosis not present

## 2018-05-09 DIAGNOSIS — F329 Major depressive disorder, single episode, unspecified: Secondary | ICD-10-CM | POA: Diagnosis present

## 2018-05-09 DIAGNOSIS — M5021 Other cervical disc displacement,  high cervical region: Secondary | ICD-10-CM | POA: Diagnosis present

## 2018-05-09 DIAGNOSIS — M542 Cervicalgia: Secondary | ICD-10-CM | POA: Diagnosis present

## 2018-05-09 DIAGNOSIS — I2510 Atherosclerotic heart disease of native coronary artery without angina pectoris: Secondary | ICD-10-CM | POA: Diagnosis present

## 2018-05-09 DIAGNOSIS — Z87891 Personal history of nicotine dependence: Secondary | ICD-10-CM | POA: Diagnosis not present

## 2018-05-09 DIAGNOSIS — Z79899 Other long term (current) drug therapy: Secondary | ICD-10-CM | POA: Diagnosis not present

## 2018-05-09 DIAGNOSIS — F419 Anxiety disorder, unspecified: Secondary | ICD-10-CM | POA: Diagnosis present

## 2018-05-09 DIAGNOSIS — E785 Hyperlipidemia, unspecified: Secondary | ICD-10-CM | POA: Diagnosis present

## 2018-05-09 DIAGNOSIS — M47812 Spondylosis without myelopathy or radiculopathy, cervical region: Secondary | ICD-10-CM | POA: Diagnosis present

## 2018-05-09 MED ORDER — METHOCARBAMOL 750 MG PO TABS: 750.0000 mg | ORAL_TABLET | Freq: Four times a day (QID) | ORAL | 0 refills | Status: AC

## 2018-05-09 MED ORDER — OXYCODONE-ACETAMINOPHEN 5-325 MG PO TABS: 1.0000 | ORAL_TABLET | ORAL | 0 refills | Status: AC | PRN

## 2018-05-09 NOTE — Progress Notes (Signed)
Patient alert and oriented, mae's well, voiding adequate amount of urine, swallowing without difficulty, no c/o pain at time of discharge. Patient discharged home with family. Script and discharged instructions given to patient. Patient and family stated understanding of instructions given. Patient has an appointment with Dr. Jones °

## 2018-05-09 NOTE — Progress Notes (Signed)
Physical Therapy Treatment and Discharge Patient Details Name: Leonard Herrera MRN: [id number] DOB: 16-Jan-1966 Today's Date: 05/09/2018    History of Present Illness 52 yo male admitted for ACDF. PMH including anxiety, depression, HTN, lumbar spinal stenosis with lumbar decompression (2017), MI, and neuropathy.     PT Comments    Pt progressing well with post-op mobility. Pt reports he feels better than he has "in a long time". Pt with somewhat decreased safety awareness at times and required cues to maintain precautions, however overall mobilizing at a supervision to mod I level. Pt anticipates d/c home today and was educated on car transfer, brace application/wearing schedule, and safe activity progression. Will sign off at this time. If needs change, please reconsult.    Follow Up Recommendations  No PT follow up;Supervision for mobility/OOB     Equipment Recommendations  None recommended by PT    Recommendations for Other Services       Precautions / Restrictions Precautions Precautions: Cervical Precaution Booklet Issued: Yes (comment) Precaution Comments: Reviewed cervical precautions and pt was cued for maintenance of precautions during functional mobility.  Required Braces or Orthoses: Other Brace/Splint Other Brace/Splint: "No brace needed" per MD order. Soft collar in room for pt to use as needed. Restrictions Weight Bearing Restrictions: No    Mobility  Bed Mobility Overal bed mobility: Needs Assistance Bed Mobility: Rolling;Sidelying to Sit Rolling: Supervision Sidelying to sit: Supervision       General bed mobility comments: Pt was received sitting up in reclinder  Transfers Overall transfer level: Modified independent Equipment used: None Transfers: Sit to/from Stand Sit to Stand: Supervision         General transfer comment: Pt demonstrated proper hand placement on seated surface for safety. No difficulty powering up to full stand.    Ambulation/Gait Ambulation/Gait assistance: Modified independent (Device/Increase time) Gait Distance (Feet): 250 Feet Assistive device: None Gait Pattern/deviations: Step-through pattern;Trunk flexed Gait velocity: Decreased  Gait velocity interpretation: 1.31 - 2.62 ft/sec, indicative of limited community ambulator General Gait Details: Pt casually ambulating without assist or unsteadiness. Pt demonstrating quick turns and direction changes without LOB and spontaneously demonstrated a squat in the middle of gait training to show he can avoid bending. Safety awareness somewhat decreased.    Stairs Stairs: Yes Stairs assistance: Supervision Stair Management: One rail Right;Alternating pattern;Forwards Number of Stairs: 10 General stair comments: Pt demonstrating stair negotiation with light supervision. No difficulty with alternating gait pattern.    Wheelchair Mobility    Modified Rankin (Stroke Patients Only)       Balance Overall balance assessment: Mild deficits observed, not formally tested                                          Cognition Arousal/Alertness: Awake/alert Behavior During Therapy: WFL for tasks assessed/performed Overall Cognitive Status: Within Functional Limits for tasks assessed                                        Exercises      General Comments        Pertinent Vitals/Pain Pain Assessment: Faces Faces Pain Scale: Hurts a little bit Pain Location: Neck Pain Descriptors / Indicators: Discomfort;Grimacing Pain Intervention(s): Monitored during session    Home Living Family/patient expects to be discharged to:: Private  residence Living Arrangements: Parent Available Help at Discharge: Family;Available 24 hours/day Type of Home: House Home Access: Stairs to enter Entrance Stairs-Rails: Right Home Layout: One level Home Equipment: Shower seat Additional Comments: Above information is parent's home  which pt will go at dc. Pt's home is a Psychologist, occupational town home with 18 stairs.     Prior Function Level of Independence: Independent          PT Goals (current goals can now be found in the care plan section) Acute Rehab PT Goals Patient Stated Goal: Go home PT Goal Formulation: With patient Time For Goal Achievement: 05/22/18 Potential to Achieve Goals: Good Progress towards PT goals: Goals met/education completed, patient discharged from PT    Frequency    Min 5X/week      PT Plan Equipment recommendations need to be updated    Co-evaluation              AM-PAC PT "6 Clicks" Daily Activity  Outcome Measure  Difficulty turning over in bed (including adjusting bedclothes, sheets and blankets)?: None Difficulty moving from lying on back to sitting on the side of the bed? : A Little Difficulty sitting down on and standing up from a chair with arms (e.g., wheelchair, bedside commode, etc,.)?: None Help needed moving to and from a bed to chair (including a wheelchair)?: None Help needed walking in hospital room?: None Help needed climbing 3-5 steps with a railing? : A Little 6 Click Score: 22    End of Session Equipment Utilized During Treatment: Cervical collar Activity Tolerance: Patient tolerated treatment well Patient left: in chair;with call bell/phone within reach Nurse Communication: Mobility status PT Visit Diagnosis: Other abnormalities of gait and mobility (R26.89);Pain;Muscle weakness (generalized) (M62.81) Pain - part of body: (neck )     Time: 4503-8882 PT Time Calculation (min) (ACUTE ONLY): 17 min  Charges:  $Gait Training: 8-22 mins                    G Codes:       Rolinda Roan, PT, DPT Acute Rehabilitation Services Pager: [phone]    Thelma Comp 05/09/2018, 8:20 AM

## 2018-05-09 NOTE — Discharge Summary (Signed)
Physician Discharge Summary  Patient ID: Leonard Herrera MRN: [id number] DOB/AGE: [birth year] 52 y.o.  Admit date: 05/08/2018 Discharge date: 05/09/2018  Admission Diagnoses:  Cervical spinal stenosis C3-4 C4-5 C5-6 and C6-7 with neck and bilateral arm pain  Discharge Diagnoses: same   Discharged Condition: good  Hospital Course: The patient was admitted on 05/08/2018 and taken to the operating room where the patient underwent acdf c3-4,4-5,5-6,6-7. The patient tolerated the procedure well and was taken to the recovery room and then to the floor in stable condition. The hospital course was routine. There were no complications. The wound remained clean dry and intact. Pt had appropriate neck soreness. No complaints of arm pain or new N/T/W. The patient remained afebrile with stable vital signs, and tolerated a regular diet. The patient continued to increase activities, and pain was well controlled with oral pain medications.   Consults: None  Significant Diagnostic Studies:  Results for orders placed or performed during the hospital encounter of 04/30/18  Surgical pcr screen  Result Value Ref Range   MRSA, PCR NEGATIVE NEGATIVE   Staphylococcus aureus NEGATIVE NEGATIVE  CBC WITH DIFFERENTIAL  Result Value Ref Range   WBC 4.8 4.0 - 10.5 K/uL   RBC 5.69 4.22 - 5.81 MIL/uL   Hemoglobin 15.0 13.0 - 17.0 g/dL   HCT 40.9 81.1 - 91.4 %   MCV 85.9 78.0 - 100.0 fL   MCH 26.4 26.0 - 34.0 pg   MCHC 30.7 30.0 - 36.0 g/dL   RDW 78.2 95.6 - 21.3 %   Platelets 183 150 - 400 K/uL   Neutrophils Relative % 47 %   Neutro Abs 2.3 1.7 - 7.7 K/uL   Lymphocytes Relative 35 %   Lymphs Abs 1.7 0.7 - 4.0 K/uL   Monocytes Relative 11 %   Monocytes Absolute 0.5 0.1 - 1.0 K/uL   Eosinophils Relative 6 %   Eosinophils Absolute 0.3 0.0 - 0.7 K/uL   Basophils Relative 1 %   Basophils Absolute 0.1 0.0 - 0.1 K/uL   Immature Granulocytes 0 %   Abs Immature Granulocytes 0.0 0.0 - 0.1 K/uL  Protime-INR  Result  Value Ref Range   Prothrombin Time 13.1 11.4 - 15.2 seconds   INR 1.00   Comprehensive metabolic panel  Result Value Ref Range   Sodium 143 135 - 145 mmol/L   Potassium 4.4 3.5 - 5.1 mmol/L   Chloride 109 98 - 111 mmol/L   CO2 26 22 - 32 mmol/L   Glucose, Bld 89 70 - 99 mg/dL   BUN 10 6 - 20 mg/dL   Creatinine, Ser 0.86 0.61 - 1.24 mg/dL   Calcium 9.3 8.9 - 57.8 mg/dL   Total Protein 6.5 6.5 - 8.1 g/dL   Albumin 3.9 3.5 - 5.0 g/dL   AST 26 15 - 41 U/L   ALT 20 0 - 44 U/L   Alkaline Phosphatase 76 38 - 126 U/L   Total Bilirubin 0.6 0.3 - 1.2 mg/dL   GFR calc non Af Amer >60 >60 mL/min   GFR calc Af Amer >60 >60 mL/min   Anion gap 8 5 - 15  Type and screen [facility]  Result Value Ref Range   ABO/RH(D) O POS    Antibody Screen NEG    Sample Expiration 05/14/2018    Extend sample reason      NO TRANSFUSIONS OR PREGNANCY IN THE PAST 3 MONTHS Performed at [facility], [address] [place], [place]  [id number]   ABO/Rh  Result Value Ref Range   ABO/RH(D)      O POS Performed at [facility], [address] [place], [place] [id number]     Chest 2 View  Result Date: 04/30/2018 CLINICAL DATA:  Preoperative for ACDF EXAM: CHEST - 2 VIEW COMPARISON:  02/14/2016 chest radiograph. FINDINGS: Stable cardiomediastinal silhouette with normal heart size. No pneumothorax. No pleural effusion. Lungs appear clear, with no acute consolidative airspace disease and no pulmonary edema. IMPRESSION: No active cardiopulmonary disease. Electronically Signed   By: Delbert PhenixJason A Poff M.D.   On: 04/30/2018 17:09   Dg Cervical Spine 2-3 Views  Result Date: 05/08/2018 CLINICAL DATA:  C3-7 ACDF EXAM: CERVICAL SPINE - 2-3 VIEW; DG C-ARM 61-120 MIN COMPARISON:  MRI cervical spine dated 01/11/2018 FLUOROSCOPY TIME:  24 seconds FINDINGS: C3-7 ACDF hardware in satisfactory position. IMPRESSION: CT 7 ACDF hardware in satisfactory position. Electronically Signed   By: Charline BillsSriyesh  Krishnan  M.D.   On: 05/08/2018 12:18   Dg C-arm 1-60 Min  Result Date: 05/08/2018 CLINICAL DATA:  C3-7 ACDF EXAM: CERVICAL SPINE - 2-3 VIEW; DG C-ARM 61-120 MIN COMPARISON:  MRI cervical spine dated 01/11/2018 FLUOROSCOPY TIME:  24 seconds FINDINGS: C3-7 ACDF hardware in satisfactory position. IMPRESSION: CT 7 ACDF hardware in satisfactory position. Electronically Signed   By: Charline BillsSriyesh  Krishnan M.D.   On: 05/08/2018 12:18    Antibiotics:  Anti-infectives (From admission, onward)   Start     Dose/Rate Route Frequency Ordered Stop   05/08/18 1700  ceFAZolin (ANCEF) IVPB 2g/100 mL premix     2 g 200 mL/hr over 30 Minutes Intravenous Every 8 hours 05/08/18 1442 05/09/18 0123   05/08/18 0944  bacitracin 50,000 Units in sodium chloride 0.9 % 500 mL irrigation  Status:  Discontinued       As needed 05/08/18 0945 05/08/18 1223   05/08/18 0700  ceFAZolin (ANCEF) IVPB 2g/100 mL premix     2 g 200 mL/hr over 30 Minutes Intravenous On call to O.R. 05/08/18 [id number] 05/08/18 0850   05/08/18 0652  ceFAZolin (ANCEF) 2-4 GM/100ML-% IVPB    Note to Pharmacy:  Edwina BarthHawks, Pamela   : cabinet override      05/08/18 0652 05/08/18 0850      Discharge Exam: Blood pressure 137/83, pulse 63, temperature 98.2 F (36.8 C), temperature source Oral, resp. rate 15, height 5\' 9"  (1.753 m), weight 110.7 kg (244 lb), SpO2 99 %. Neurologic: Grossly normal Ambulating and voiding well  Discharge Medications:   Allergies as of 05/09/2018   No Known Allergies     Medication List    TAKE these medications   aspirin EC 81 MG tablet Take 81 mg by mouth daily.   atorvastatin 20 MG tablet Commonly known as:  LIPITOR Take 20 mg by mouth daily.   cetirizine 5 MG tablet Commonly known as:  ZYRTEC Take 5 mg by mouth daily.   clopidogrel 75 MG tablet Commonly known as:  PLAVIX Take 75 mg by mouth daily.   fluticasone 50 MCG/ACT nasal spray Commonly known as:  FLONASE Place 1 spray into both nostrils 2 (two) times daily.    ipratropium 0.06 % nasal spray Commonly known as:  ATROVENT Place 1 spray into both nostrils 2 (two) times daily as needed for rhinitis.   labetalol 300 MG tablet Commonly known as:  NORMODYNE Take 300 mg by mouth 2 (two) times daily.   lisinopril 40 MG tablet Commonly known as:  PRINIVIL,ZESTRIL Take 40  mg by mouth daily.   magnesium oxide 400 MG tablet Commonly known as:  MAG-OX Take 400 mg by mouth 2 (two) times daily.   methocarbamol 750 MG tablet Commonly known as:  ROBAXIN Take 750 mg by mouth 4 (four) times daily. What changed:  Another medication with the same name was added. Make sure you understand how and when to take each.   methocarbamol 750 MG tablet Commonly known as:  ROBAXIN Take 1 tablet (750 mg total) by mouth every 6 (six) hours. What changed:  You were already taking a medication with the same name, and this prescription was added. Make sure you understand how and when to take each.   multivitamin tablet Take 1 tablet by mouth daily.   oxyCODONE-acetaminophen 10-325 MG tablet Commonly known as:  PERCOCET Take 1 tablet by mouth every 4 (four) hours as needed for pain. What changed:  when to take this   oxyCODONE-acetaminophen 5-325 MG tablet Commonly known as:  PERCOCET/ROXICET Take 1 tablet by mouth every 4 (four) hours as needed for moderate pain. What changed:  You were already taking a medication with the same name, and this prescription was added. Make sure you understand how and when to take each.   pregabalin 300 MG capsule Commonly known as:  LYRICA Take 300 mg by mouth 2 (two) times daily.   triamcinolone lotion 0.1 % Commonly known as:  KENALOG Apply 1 application topically 3 (three) times daily.   VISINE OP Place 1 drop into both eyes daily as needed (for allergies).       Disposition: home   Final Dx: acdf c3-4,4-5,5-6,6-7  Discharge Instructions     Remove dressing in 72 hours   Complete by:  As directed    Call MD for:   difficulty breathing, headache or visual disturbances   Complete by:  As directed    Call MD for:  extreme fatigue   Complete by:  As directed    Call MD for:  hives   Complete by:  As directed    Call MD for:  persistant dizziness or light-headedness   Complete by:  As directed    Call MD for:  persistant nausea and vomiting   Complete by:  As directed    Call MD for:  redness, tenderness, or signs of infection (pain, swelling, redness, odor or green/yellow discharge around incision site)   Complete by:  As directed    Call MD for:  severe uncontrolled pain   Complete by:  As directed    Call MD for:  temperature >100.4   Complete by:  As directed    Diet - low sodium heart healthy   Complete by:  As directed    Driving Restrictions   Complete by:  As directed    No driving 2 weeks   Increase activity slowly   Complete by:  As directed    Lifting restrictions   Complete by:  As directed    Nothing heavier than 8 lbs         Signed: Tiana Loft Quamel Fitzmaurice 05/09/2018, 7:35 AM

## 2018-05-09 NOTE — Progress Notes (Signed)
Pt set up with CPAP for the night.  Pt provided personal mask from home. No issues noted at time of set up.  Pt tolerating well.

## 2018-05-09 NOTE — Progress Notes (Signed)
OT Treatment  Pt progressing towards established OT goals. Pt performing dressing, toileting, hand hygiene, and functional mobility at supervision level. Providing education on UB dressing techniques for adherence to cervical precaution, and pt demonstrating understanding. Also, educating pt on soft collar management. Reviewing precautions and education in preparation for dc later today. Answering all pt questions. Continue to recommend dc home once medically stable per physician. All acute OT needs met and will sign off.     05/09/18 0700  OT Visit Information  Last OT Received On 05/09/18  Assistance Needed +1  History of Present Illness 52 yo male admitted for ACDF. PMH including anxiety, depression, HTN, lumbar spinal stenosis with lumbar decompression (2017), MI, and neuropathy.   Precautions  Precautions Cervical  Precaution Booklet Issued Yes (comment)  Precaution Comments Reviewed cervical precautions.  Required Braces or Orthoses Other Brace/Splint  Other Brace/Splint "No brace needed" per MD order. Soft collar in room for pt to use as needed.  Pain Assessment  Pain Assessment Faces  Faces Pain Scale 2  Pain Location Neck  Pain Descriptors / Indicators Discomfort;Grimacing  Pain Intervention(s) Monitored during session;Repositioned  Cognition  Arousal/Alertness Awake/alert  Behavior During Therapy WFL for tasks assessed/performed  Overall Cognitive Status Within Functional Limits for tasks assessed  Upper Extremity Assessment  Upper Extremity Assessment RUE deficits/detail  RUE Deficits / Details Right thumb numbness  Lower Extremity Assessment  Lower Extremity Assessment Defer to PT evaluation  ADL  Overall ADL's  Needs assistance/impaired  Grooming Details (indicate cue type and reason) Reviewed education on compensatory techniques  Lower Body Bathing Details (indicate cue type and reason) Reviewed education on compensatory techniques  Upper Body Dressing   Supervision/safety;Standing;Cueing for compensatory techniques  Upper Body Dressing Details (indicate cue type and reason) Educating pt on compensatory techniques. Pt donning shirt while standing with supervision demosntrating understanding. Pt also donning/doffing soft collar with education cues for precautions and sequencing  Lower Body Dressing Sit to/from stand;Supervision/safety  Lower Body Dressing Details (indicate cue type and reason) Pt donning underwear and pants with supervision for safety  Functional mobility during ADLs Supervision/safety  General ADL Comments Pt performing ADLs and functional mobility with supervision. Reviewing all precautions and compensatory tehcniques for adherance during ADLs.   Bed Mobility  Overal bed mobility Needs Assistance  Bed Mobility Rolling;Sidelying to Sit  Rolling Supervision  Sidelying to sit Supervision  General bed mobility comments supervision for safety  Balance  Overall balance assessment Mild deficits observed, not formally tested  Restrictions  Weight Bearing Restrictions No  Transfers  Overall transfer level Needs assistance  Equipment used None  Transfers Sit to/from Stand  Sit to Stand Supervision  General transfer comment supervision for safety  OT - End of Session  Equipment Utilized During Treatment Cervical collar  Activity Tolerance Patient tolerated treatment well  Patient left in chair;with call bell/phone within reach;with family/visitor present  Nurse Communication Mobility status;Precautions  OT Assessment/Plan  OT Plan Discharge plan remains appropriate  OT Visit Diagnosis Unsteadiness on feet (R26.81);Other abnormalities of gait and mobility (R26.89);Muscle weakness (generalized) (M62.81);Pain  Pain - part of body  (Neck)  OT Frequency (ACUTE ONLY) Min 2X/week  Recommendations for Other Services PT consult  Follow Up Recommendations No OT follow up;Supervision/Assistance - 24 hour  OT Equipment None recommended  by OT  AM-PAC OT "6 Clicks" Daily Activity Outcome Measure  Help from another person eating meals? 4  Help from another person taking care of personal grooming? 3  Help from another person  toileting, which includes using toliet, bedpan, or urinal? 3  Help from another person bathing (including washing, rinsing, drying)? 3  Help from another person to put on and taking off regular upper body clothing? 3  Help from another person to put on and taking off regular lower body clothing? 3  6 Click Score 19  ADL G Code Conversion CK  Acute Rehab OT Goals  Patient Stated Goal Go home  OT Goal Formulation With patient  Time For Goal Achievement 05/22/18  Potential to Achieve Goals Good  ADL Goals  Pt Will Perform Upper Body Dressing with modified independence;sitting  Additional ADL Goal #1 Pt will perform log roll technique for bed mobility at Mod I level in preparation for ADLs  Additional ADL Goal #2 Pt will adhere to cervical precautions during ADLs with 1-2 VCs  OT Time Calculation  OT Start Time (ACUTE ONLY) 0732  OT Stop Time (ACUTE ONLY) 0746  OT Time Calculation (min) 14 min  OT General Charges  $OT Visit 1 Visit  OT Treatments  $Self Care/Home Management  8-22 mins    Henchy Mccauley MSOT, OTR/L Acute Rehab Pager: [phone] Office: [phone]

## 2020-06-20 IMAGING — CR DG CHEST 2V
2 series · 2 of 2 positions shown · non-contrast
Comparison: 02/14/2016 chest radiograph.

CLINICAL DATA: Preoperative for ACDF

EXAM:
CHEST - 2 VIEW

[w chest pa]
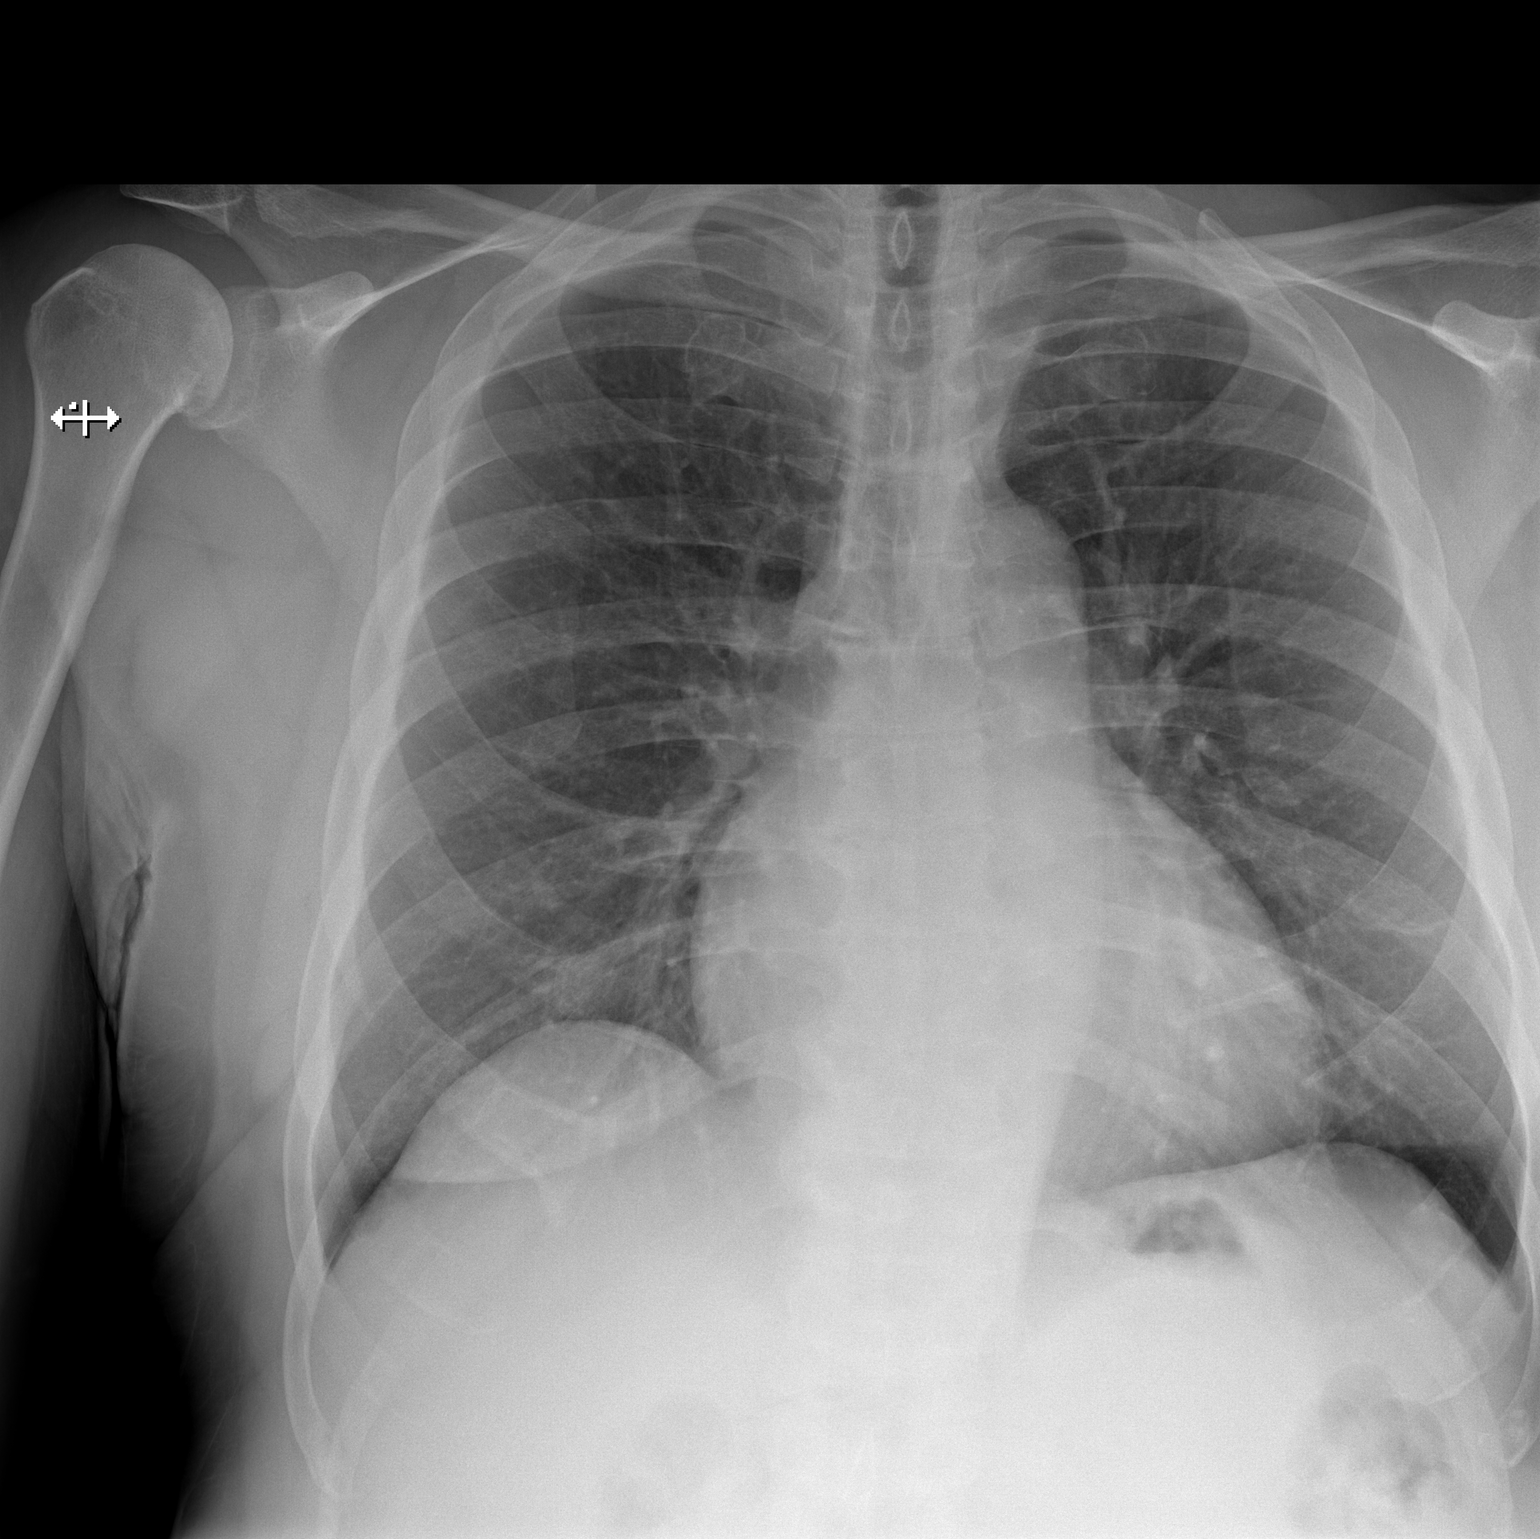

[w chest lat]
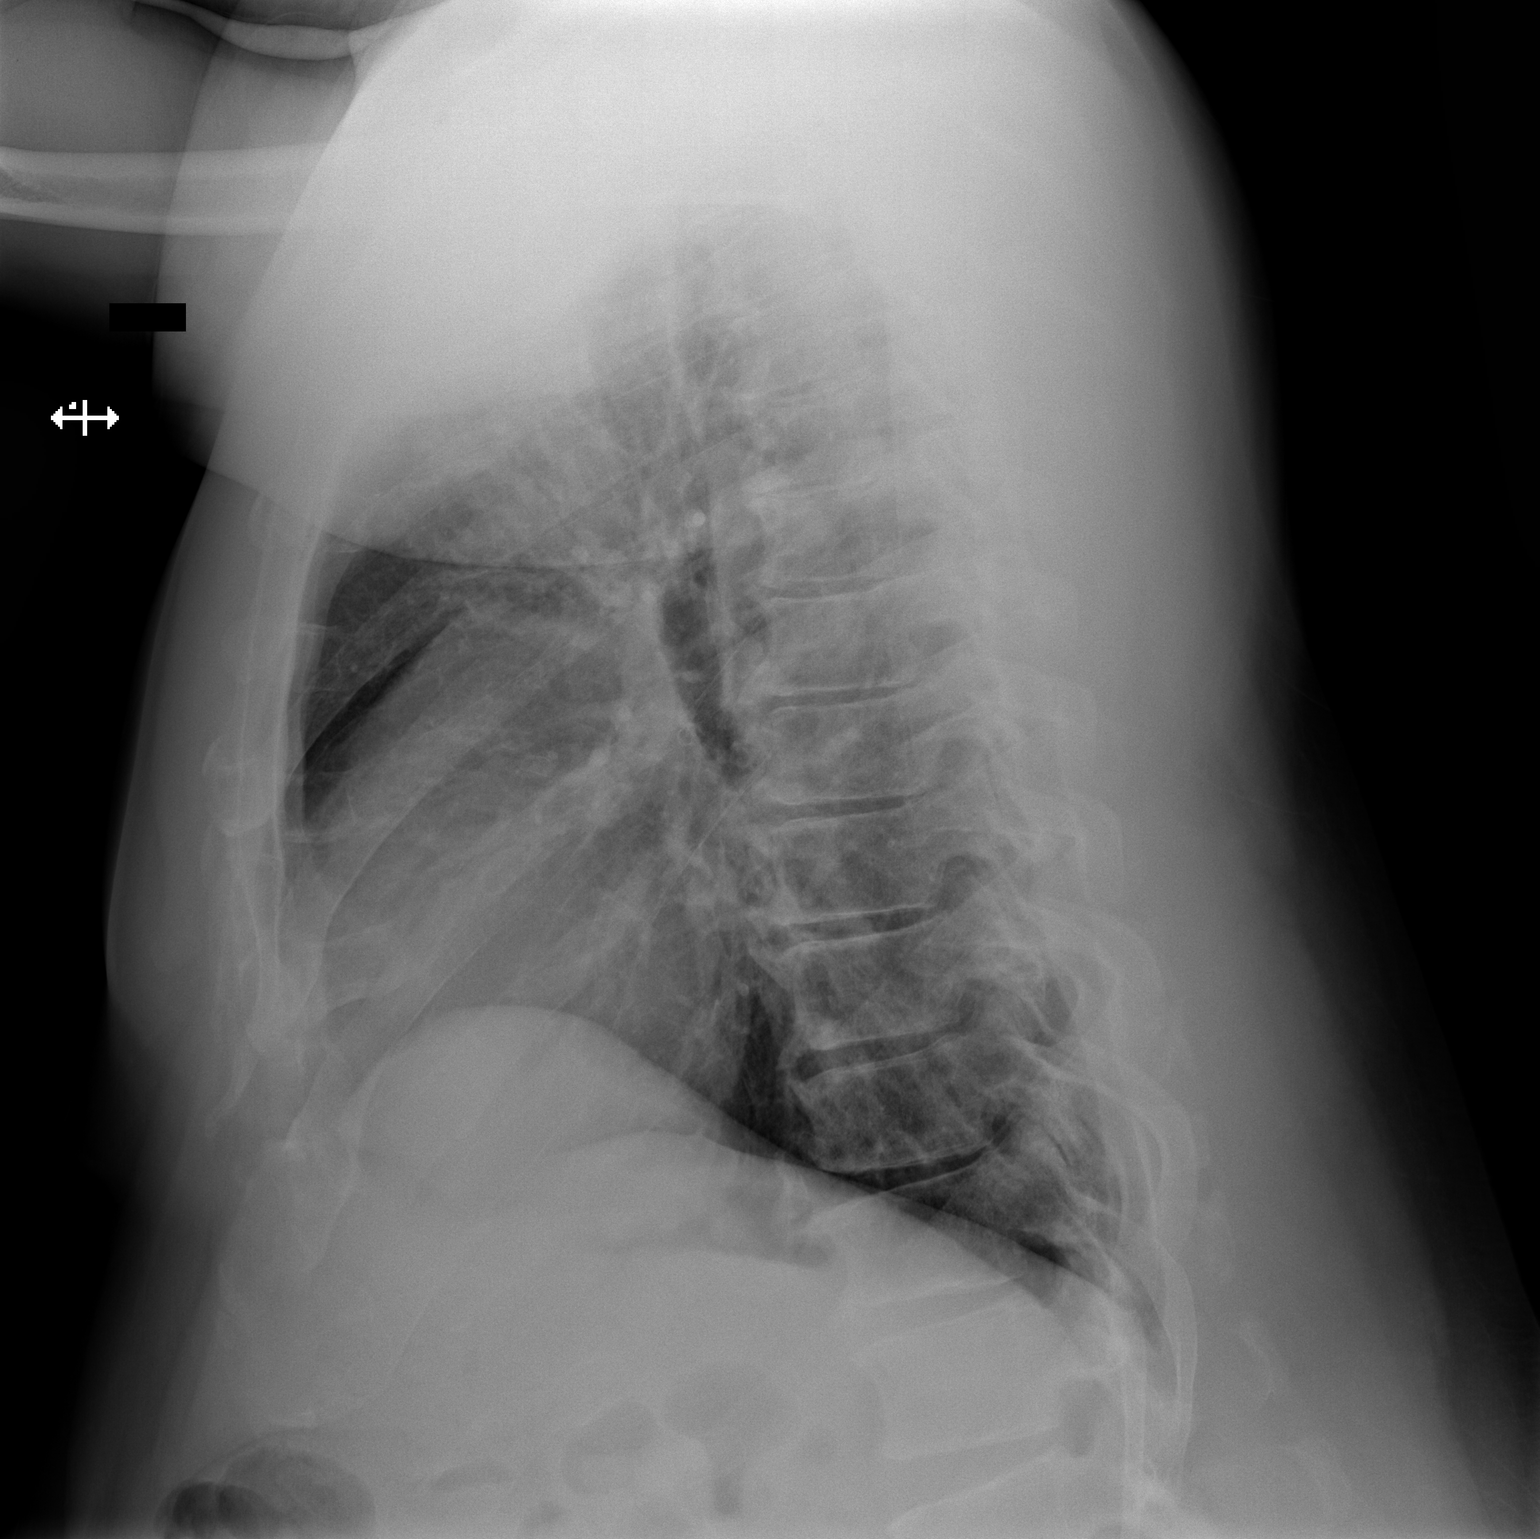

[2 of 2 positions shown; findings below may reference images not displayed]

FINDINGS: Stable cardiomediastinal silhouette with normal heart size. No
pneumothorax. No pleural effusion. Lungs appear clear, with no acute
consolidative airspace disease and no pulmonary edema.
IMPRESSION: No active cardiopulmonary disease.
